# Patient Record
Sex: Male | Born: 1965 | Race: White | Hispanic: No | Marital: Married | Smoking: Former smoker
Health system: Southern US, Community
[De-identification: ages and names within clinical notes are randomized; demographics above are authoritative.]

## PROBLEM LIST (undated history)

## (undated) DIAGNOSIS — C329 Malignant neoplasm of larynx, unspecified: Secondary | ICD-10-CM

## (undated) DIAGNOSIS — M1990 Unspecified osteoarthritis, unspecified site: Secondary | ICD-10-CM

## (undated) HISTORY — DX: Unspecified osteoarthritis, unspecified site: M19.90

## (undated) HISTORY — PX: PLANTAR FASCIA RELEASE: SHX2239

## (undated) HISTORY — PX: FOOT SURGERY: SHX648

## (undated) HISTORY — PX: VASECTOMY: SHX75

## (undated) HISTORY — DX: Malignant neoplasm of larynx, unspecified: C32.9

## (undated) HISTORY — PX: BUNIONECTOMY: SHX129

---

## 2013-07-30 ENCOUNTER — Encounter (INDEPENDENT_AMBULATORY_CARE_PROVIDER_SITE_OTHER): Payer: Self-pay | Admitting: *Deleted

## 2013-08-26 ENCOUNTER — Ambulatory Visit (INDEPENDENT_AMBULATORY_CARE_PROVIDER_SITE_OTHER): Payer: TRICARE For Life (TFL) | Admitting: Internal Medicine

## 2013-08-26 ENCOUNTER — Encounter (INDEPENDENT_AMBULATORY_CARE_PROVIDER_SITE_OTHER): Payer: Self-pay | Admitting: Internal Medicine

## 2013-08-26 VITALS — BP 100/62 | HR 70 | Temp 98.3°F | Ht 69.0 in | Wt 179.1 lb

## 2013-08-26 DIAGNOSIS — R748 Abnormal levels of other serum enzymes: Secondary | ICD-10-CM

## 2013-08-26 LAB — COMPREHENSIVE METABOLIC PANEL
AST: 20 U/L (ref 0–37)
Alkaline Phosphatase: 90 U/L (ref 39–117)
BUN: 11 mg/dL (ref 6–23)
CO2: 27 mEq/L (ref 19–32)
Calcium: 9 mg/dL (ref 8.4–10.5)
Chloride: 105 mEq/L (ref 96–112)
Creat: 0.86 mg/dL (ref 0.50–1.35)
Glucose, Bld: 71 mg/dL (ref 70–99)

## 2013-08-26 NOTE — Progress Notes (Addendum)
Subjective:     Patient ID: Jeffery Ortiz, male   DOB: 1965/12/25, 47 y.o.   MRN: [id number]  HPI Referred to our office by Dr. Loney Hering for elevated liver enzymes. Apparently was seen in the ED at [facility] in September for urinary obstruction (could not void)and noted his LFTs were elevated. He says before going to the ED, he had diarrhea, felt drained, and also had a fever.  He received 1 liter of fluid in the ED. Felt he was dehydrated.  Bilirubin was also elevated. He was discharged and apparently was also seen at [facility] 2 days later. He was unable to void.  Foley cath was inserted and he was advised to follow up with a urologist. He kept the foley for a week. He followed up with urologist at [facility] and was told he had prostatitis. He was placed on Cipro and Flomax x 3 weeks. He tells me now he is fine. No problems with voiding.  His urine is clear. No blood.  Appetite is good. No weight loss. He is 100% better at this time.  CT abdomen/pelvis w/cm at [facility]: 07/12/2013 1. Trace free fluid in the pelvis, abnormal but nonspecific. 2. Trace fluid along the inferior pole of the right kidney, nonspecific. Recommend correlation with urinalysis. 3. Colonic diverticulosis without CT evidence of acute diverticulitis. 4. Prostatomegaly. The bladder is decompressed and a Foley catheter is present. Recommend correlation for signs/symptoms of chronic bladder outlet obstruction.  Findings discussed with Yehuda Budd, NP of the emergency department by Dr. Daralene Milch via telephone at approximately 1453 on July 12, 2013.    07/10/2013 ALP 120, AST 50, Total bil 2.8, ALT 72. H and H 14.5 and 42.1 Urinalysis: Moderate leukocytes (40-50). RBC 75-100.  Negative for bilirubin. Large amt of blood.  07/12/2013 Total bili 1.8, ALP 146, AST 36, ALT 77  Review of Systems see hpi Current Outpatient Prescriptions  Medication Sig Dispense Refill  . calcium carbonate 200 MG capsule Take 250 mg by mouth 2 (two) times  daily with a meal.      . Multiple Vitamin (MULTIVITAMIN) tablet Take 1 tablet by mouth daily.       No current facility-administered medications for this visit.   History reviewed. No pertinent past medical history. Past Surgical History  Procedure Laterality Date  . Vasectomy    . Foot surgery      x 4 (bone spurs)         Objective:   Physical Exam  Filed Vitals:   08/26/13 1600  BP: 100/62  Pulse: 70  Temp: 98.3 F (36.8 C)  Height: 5\' 9"  (1.753 m)  Weight: 179 lb 1.6 oz (81.239 kg)   Alert and oriented. Skin warm and dry. Oral mucosa is moist.   . Sclera anicteric, conjunctivae is pink. Thyroid not enlarged. No cervical lymphadenopathy. Lungs clear. Heart regular rate and rhythm.  Abdomen is soft. Bowel sounds are positive. No hepatomegaly. No abdominal masses felt. No tenderness.  No edema to lower extremities. No jaundice.     Assessment:    Elevated liver enzymes. ? Related to his dehydration.    Plan:    I am going to repeat his CMet. If normal, no further work-up. If elevated will do full work up.

## 2013-08-26 NOTE — Patient Instructions (Signed)
Cmet today. If normal, no further work up

## 2013-08-27 ENCOUNTER — Telehealth (INDEPENDENT_AMBULATORY_CARE_PROVIDER_SITE_OTHER): Payer: Self-pay | Admitting: *Deleted

## 2013-08-27 NOTE — Telephone Encounter (Signed)
.  Per Terri Setzer,NP labs in 3 months. 

## 2013-09-02 NOTE — Progress Notes (Signed)
Apt has been scheduled for 12/03/12 with Terri Setzer, NP.  

## 2013-09-02 NOTE — Progress Notes (Signed)
Apt has been scheduled for 12/03/12 with Dorene Ar, NP.

## 2013-09-10 ENCOUNTER — Encounter (HOSPITAL_COMMUNITY): Payer: Self-pay | Admitting: Emergency Medicine

## 2013-09-10 ENCOUNTER — Emergency Department (HOSPITAL_COMMUNITY)
Admission: EM | Admit: 2013-09-10 | Discharge: 2013-09-10 | Disposition: A | Discharge status: Home / Self Care | Payer: Worker's Compensation | Attending: Emergency Medicine | Admitting: Emergency Medicine

## 2013-09-10 DIAGNOSIS — Y9389 Activity, other specified: Secondary | ICD-10-CM | POA: Insufficient documentation

## 2013-09-10 DIAGNOSIS — T25211A Burn of second degree of right ankle, initial encounter: Secondary | ICD-10-CM

## 2013-09-10 DIAGNOSIS — Y990 Civilian activity done for income or pay: Secondary | ICD-10-CM | POA: Insufficient documentation

## 2013-09-10 DIAGNOSIS — IMO0002 Reserved for concepts with insufficient information to code with codable children: Secondary | ICD-10-CM | POA: Insufficient documentation

## 2013-09-10 DIAGNOSIS — Z87891 Personal history of nicotine dependence: Secondary | ICD-10-CM | POA: Insufficient documentation

## 2013-09-10 DIAGNOSIS — Y9289 Other specified places as the place of occurrence of the external cause: Secondary | ICD-10-CM | POA: Insufficient documentation

## 2013-09-10 DIAGNOSIS — T25219A Burn of second degree of unspecified ankle, initial encounter: Secondary | ICD-10-CM | POA: Insufficient documentation

## 2013-09-10 MED ORDER — SILVER SULFADIAZINE 1 % EX CREA
TOPICAL_CREAM | Freq: Once | CUTANEOUS | Status: AC
  Administered 2013-09-10: 1 via TOPICAL
  Filled 2013-09-10: qty 50

## 2013-09-10 MED ORDER — HYDROCODONE-ACETAMINOPHEN 5-325 MG PO TABS
1.0000 | ORAL_TABLET | Freq: Once | ORAL | Status: AC
  Administered 2013-09-10: 1 via ORAL
  Filled 2013-09-10: qty 1

## 2013-09-10 MED ORDER — HYDROCODONE-ACETAMINOPHEN 5-325 MG PO TABS: 1.0000 | ORAL_TABLET | ORAL | Status: DC | PRN

## 2013-09-10 NOTE — ED Notes (Signed)
Right foot cleaned with saf clens, soap, and cool water. Telfa, kerlix, and ace bandage applied.

## 2013-09-10 NOTE — ED Provider Notes (Signed)
CSN: [id number]     Arrival date & time 09/10/13  1239 History   First MD Initiated Contact with Patient 09/10/13 1258     No chief complaint on file.  (Consider location/radiation/quality/duration/timing/severity/associated sxs/prior Treatment) Patient is a 47 y.o. male presenting with burn.  Burn Burn location:  Leg Leg burn location:  R ankle Burn quality:  Painful and red Time since incident:  1 hour Progression:  Unchanged Pain details:    Severity:  Moderate   Timing:  Constant Mechanism of burn:  Chemical Incident location:  Work Relieved by:  None tried Worsened by:  Rubbing and movement Ineffective treatments:  None tried Tetanus status:  Up to date  ONEY FOLZ is a 47 y.o. male who presents to the ED with a burn to the inside of the right ankle. He was working on a tractor when a Arts administrator blew off and antifreeze burned his right ankle. Some also splashed on the left calf. He has a blister on the ankle and red spots on the calf. He denies any other injuries.   History reviewed. No pertinent past medical history. Past Surgical History  Procedure Laterality Date  . Vasectomy    . Foot surgery      x 4 (bone spurs)   No family history on file. History  Substance Use Topics  . Smoking status: Former Games developer  . Smokeless tobacco: Not on file     Comment: quit 1999  . Alcohol Use: Yes     Comment: occasionally    Review of Systems Negative except as stated in HPI  Allergies  Review of patient's allergies indicates no known allergies.  Home Medications   Current Outpatient Rx  Name  Route  Sig  Dispense  Refill  . calcium carbonate 200 MG capsule   Oral   Take 250 mg by mouth 2 (two) times daily with a meal.         . Multiple Vitamin (MULTIVITAMIN) tablet   Oral   Take 1 tablet by mouth daily.          BP 124/87  Pulse 84  Temp(Src) 98.2 F (36.8 C) (Oral)  Resp 18  Ht 5\' 9"  (1.753 m)  Wt 170 lb (77.111 kg)  BMI 25.09 kg/m2  SpO2  100% Physical Exam  Nursing note and vitals reviewed. Constitutional: He is oriented to person, place, and time. He appears well-developed and well-nourished. No distress.  HENT:  Head: Normocephalic and atraumatic.  Eyes: Conjunctivae and EOM are normal.  Neck: Normal range of motion. Neck supple.  Cardiovascular: Normal rate.   Pulmonary/Chest: Effort normal.  Musculoskeletal:       Right ankle: He exhibits swelling. He exhibits normal range of motion, no laceration and normal pulse. Tenderness.       Feet:  Approximately 2 cm blister area to the inner aspect of the right ankle with small area of erythema surrounding the blister.  Neurological: He is alert and oriented to person, place, and time. No cranial nerve deficit.  Skin: Skin is warm and dry.  Psychiatric: He has a normal mood and affect. His behavior is normal.    ED Course  Procedures  EKG Interpretation   None      MDM  47 y.o. male with second degree burn to the right ankle after injury at work. Area cleaned with sur cleanse and NSS. Silvadene burn dressing applied. Patient to return in the morning for recheck.  Discussed with the patient and  all questioned fully answered.    Medication List    TAKE these medications       HYDROcodone-acetaminophen 5-325 MG per tablet  Commonly known as:  NORCO/VICODIN  Take 1 tablet by mouth every 4 (four) hours as needed.      ASK your doctor about these medications       calcium carbonate 200 MG capsule  Take 250 mg by mouth 2 (two) times daily with a meal.     multivitamin tablet  Take 1 tablet by mouth daily.         Janne Napoleon, NP 09/10/13 1721

## 2013-09-10 NOTE — ED Notes (Signed)
Pt reports was at work and the radiator hose on a tractor blew off and antifreeze burned his r ankle and left calf.  Blister to r ankle.  Red splotches to left calf.

## 2013-09-11 ENCOUNTER — Emergency Department (HOSPITAL_COMMUNITY)
Admission: EM | Admit: 2013-09-11 | Discharge: 2013-09-11 | Disposition: A | Discharge status: Home / Self Care | Payer: Worker's Compensation | Attending: Emergency Medicine | Admitting: Emergency Medicine

## 2013-09-11 ENCOUNTER — Encounter (HOSPITAL_COMMUNITY): Payer: Self-pay | Admitting: Emergency Medicine

## 2013-09-11 DIAGNOSIS — T25219A Burn of second degree of unspecified ankle, initial encounter: Secondary | ICD-10-CM | POA: Insufficient documentation

## 2013-09-11 DIAGNOSIS — Z87891 Personal history of nicotine dependence: Secondary | ICD-10-CM | POA: Insufficient documentation

## 2013-09-11 DIAGNOSIS — IMO0002 Reserved for concepts with insufficient information to code with codable children: Secondary | ICD-10-CM | POA: Insufficient documentation

## 2013-09-11 DIAGNOSIS — Y929 Unspecified place or not applicable: Secondary | ICD-10-CM | POA: Insufficient documentation

## 2013-09-11 DIAGNOSIS — Y939 Activity, unspecified: Secondary | ICD-10-CM | POA: Insufficient documentation

## 2013-09-11 NOTE — ED Provider Notes (Signed)
  Medical screening examination/treatment/procedure(s) were performed by non-physician practitioner and as supervising physician I was immediately available for consultation/collaboration.     Rosalene Wardrop, MD 09/11/13 0755 

## 2013-09-11 NOTE — ED Notes (Signed)
Here for wound recheck right foot  Burned yesterday from antifreeze .

## 2013-09-12 NOTE — ED Provider Notes (Signed)
CSN: [id number]     Arrival date & time 09/11/13  1149 History   First MD Initiated Contact with Patient 09/11/13 1159     Chief Complaint  Patient presents with  . Wound Check   (Consider location/radiation/quality/duration/timing/severity/associated sxs/prior Treatment) Patient is a 47 y.o. male presenting with wound check. The history is provided by the patient.  Wound Check This is a new problem. Episode onset: 09/10/13. The problem occurs constantly. The problem has been gradually improving. Pertinent negatives include no chills, fever, joint swelling, myalgias, nausea, neck pain, numbness, rash, vomiting or weakness. The symptoms are aggravated by walking. Treatments tried: silvadene cream. The treatment provided moderate relief.   Patient returns here for recheck of a burn the right ankle that resulted from hot liquid.  Has blister to the ankle.  Reports redness and pain are improving.  He denies any worsening symptoms   History reviewed. No pertinent past medical history. Past Surgical History  Procedure Laterality Date  . Vasectomy    . Foot surgery      x 4 (bone spurs)   History reviewed. No pertinent family history. History  Substance Use Topics  . Smoking status: Former Games developer  . Smokeless tobacco: Not on file     Comment: quit 1999  . Alcohol Use: Yes     Comment: occasionally    Review of Systems  Constitutional: Negative for fever and chills.  Gastrointestinal: Negative for nausea and vomiting.  Genitourinary: Negative for dysuria and difficulty urinating.  Musculoskeletal: Negative for gait problem, joint swelling, myalgias and neck pain.  Skin: Negative for color change, rash and wound.       Burn to the right ankle  Neurological: Negative for weakness and numbness.  All other systems reviewed and are negative.    Allergies  Review of patient's allergies indicates no known allergies.  Home Medications   Current Outpatient Rx  Name  Route  Sig   Dispense  Refill  . calcium carbonate 200 MG capsule   Oral   Take 250 mg by mouth 2 (two) times daily with a meal.         . HYDROcodone-acetaminophen (NORCO/VICODIN) 5-325 MG per tablet   Oral   Take 1 tablet by mouth every 4 (four) hours as needed.   15 tablet   0   . Multiple Vitamin (MULTIVITAMIN) tablet   Oral   Take 1 tablet by mouth daily.          BP 118/77  Pulse 71  Temp(Src) 97.8 F (36.6 C) (Oral)  Resp 18  SpO2 100% Physical Exam  Nursing note and vitals reviewed. Constitutional: He is oriented to person, place, and time. He appears well-developed and well-nourished. No distress.  HENT:  Head: Normocephalic and atraumatic.  Cardiovascular: Normal rate, regular rhythm, normal heart sounds and intact distal pulses.   Pulmonary/Chest: Effort normal and breath sounds normal. No respiratory distress.  Musculoskeletal: He exhibits tenderness. He exhibits no edema.  4 cm burn to the anterior right ankle with a small intact blister present.  ROM is preserved.  DP pulse is brisk,distal sensation intact.  No erythema, abrasion, bruising or bony deformity.  No proximal tenderness.  Neurological: He is alert and oriented to person, place, and time. He exhibits normal muscle tone. Coordination normal.  Skin: Skin is warm and dry. No rash noted.    ED Course  Procedures (including critical care time) Labs Review Labs Reviewed - No data to display Imaging Review No results found.  EKG Interpretation   None       MDM   1. Burn, second degree    Burn appears to be healing.  Blister remains intact.  Patient reports redness and pain are improved.  Wound re-dressed with silvadene.  Appears stable for d/c.  Agrees to return if needed    Karmina Zufall L. Trisha Mangle, PA-C 09/12/13 2134

## 2013-09-13 NOTE — ED Provider Notes (Signed)
Medical screening examination/treatment/procedure(s) were performed by non-physician practitioner and as supervising physician I was immediately available for consultation/collaboration.  EKG Interpretation   None         Amesha Bailey L Burdette Forehand, MD 09/13/13 0716 

## 2013-10-01 ENCOUNTER — Encounter (INDEPENDENT_AMBULATORY_CARE_PROVIDER_SITE_OTHER): Payer: Self-pay

## 2013-11-12 ENCOUNTER — Other Ambulatory Visit (INDEPENDENT_AMBULATORY_CARE_PROVIDER_SITE_OTHER): Payer: Self-pay | Admitting: *Deleted

## 2013-11-12 ENCOUNTER — Encounter (INDEPENDENT_AMBULATORY_CARE_PROVIDER_SITE_OTHER): Payer: Self-pay | Admitting: *Deleted

## 2013-11-12 DIAGNOSIS — R740 Nonspecific elevation of levels of transaminase and lactic acid dehydrogenase [LDH]: Principal | ICD-10-CM

## 2013-11-12 DIAGNOSIS — R7401 Elevation of levels of liver transaminase levels: Secondary | ICD-10-CM

## 2013-11-27 LAB — HEPATIC FUNCTION PANEL
ALBUMIN: 4.6 g/dL (ref 3.5–5.2)
ALT: 28 U/L (ref 0–53)
AST: 20 U/L (ref 0–37)
Alkaline Phosphatase: 81 U/L (ref 39–117)
Bilirubin, Direct: 0.3 mg/dL (ref 0.0–0.3)
Indirect Bilirubin: 1.4 mg/dL — ABNORMAL HIGH (ref 0.2–1.2)
Total Bilirubin: 1.7 mg/dL — ABNORMAL HIGH (ref 0.2–1.2)
Total Protein: 6.8 g/dL (ref 6.0–8.3)

## 2013-12-03 ENCOUNTER — Ambulatory Visit (INDEPENDENT_AMBULATORY_CARE_PROVIDER_SITE_OTHER): Payer: BC Managed Care – PPO | Admitting: Internal Medicine

## 2013-12-03 ENCOUNTER — Encounter (INDEPENDENT_AMBULATORY_CARE_PROVIDER_SITE_OTHER): Payer: Self-pay | Admitting: Internal Medicine

## 2013-12-03 VITALS — BP 126/60 | HR 72 | Temp 97.3°F | Ht 69.0 in | Wt 189.0 lb

## 2013-12-03 DIAGNOSIS — R17 Unspecified jaundice: Secondary | ICD-10-CM | POA: Insufficient documentation

## 2013-12-03 NOTE — Progress Notes (Addendum)
Subjective:     Patient ID: Jeffery Ortiz, male   DOB: 1965-11-26, 48 y.o.   MRN: [id number]  HPI Here today for f/u of elevated LFTs. He was last seen in November of 2014. He was referred here by Dr. Wenda Overland. States he is doing okay.  Appetite is good. No weight loss. Has regular BMs.  No abdominal pain. He is voiding without difficulty.  Continues to work.  Seen at [facility] in September for urinary obstruction. Foley inserted. He had prostatitis.  Also seen at [facility] for same. Noted his liver enzymes were elevated at that time with an elevated bilirubin. Repeat cmet was normal.     He is 100% better. He has 4 tattoos. No IV drug use. He has given blood.        07/10/2013 ALP 120, AST 50, Total bil 2.0, ALT 72.  Hepatic Function Panel     Component Value Date/Time   PROT 6.8 11/27/2013 1020   ALBUMIN 4.6 11/27/2013 1020   AST 20 11/27/2013 1020   ALT 28 11/27/2013 1020   ALKPHOS 81 11/27/2013 1020   BILITOT 1.7* 11/27/2013 1020   BILIDIR 0.3 11/27/2013 1020   IBILI 1.4* 11/27/2013 1020   CMP     Component Value Date/Time   NA 139 08/26/2013 1625   K 3.8 08/26/2013 1625   CL 105 08/26/2013 1625   CO2 27 08/26/2013 1625   GLUCOSE 71 08/26/2013 1625   BUN 11 08/26/2013 1625   CREATININE 0.86 08/26/2013 1625   CALCIUM 9.0 08/26/2013 1625   PROT 6.8 11/27/2013 1020   ALBUMIN 4.6 11/27/2013 1020   AST 20 11/27/2013 1020   ALT 28 11/27/2013 1020   ALKPHOS 81 11/27/2013 1020   BILITOT 1.7* 11/27/2013 1020          CT abdomen/pelvis w/cm at [facility]: 07/12/2013 1. Trace free fluid in the pelvis, abnormal but nonspecific. 2. Trace fluid along the inferior pole of the right kidney, nonspecific. Recommend correlation with urinalysis. 3. Colonic diverticulosis without CT evidence of acute diverticulitis. 4. Prostatomegaly. The bladder is decompressed and a Foley catheter is present. Recommend correlation for signs/symptoms of chronic bladder outlet obstruction.  Findings discussed with Vaughan Browner, NP of the  emergency department by Dr. Wallis Bamberg via telephone at approximately 1453 on July 12, 2013.      Review of Systems  History reviewed. No pertinent past medical history. Past Surgical History  Procedure Laterality Date  . Vasectomy    . Foot surgery      x 4 (bone spurs)   No Known Allergies   No Known Allergies  History reviewed. No pertinent past medical history.  Past Surgical History  Procedure Laterality Date  . Vasectomy    . Foot surgery      x 4 (bone spurs)    No Known Allergies  Current Outpatient Prescriptions on File Prior to Visit  Medication Sig Dispense Refill  . calcium carbonate 200 MG capsule Take 250 mg by mouth 2 (two) times daily with a meal.      . Multiple Vitamin (MULTIVITAMIN) tablet Take 1 tablet by mouth daily.       No current facility-administered medications on file prior to visit.        Objective:   Physical Exam  Filed Vitals:   12/03/13 1555  BP: 126/60  Pulse: 72  Temp: 97.3 F (36.3 C)  Height: 5\' 9"  (1.753 m)  Weight: 189 lb (85.73 kg)    Alert and  oriented. Skin warm and dry. Oral mucosa is moist.   . Sclera anicteric, conjunctivae is pink. Thyroid not enlarged. No cervical lymphadenopathy. Lungs clear. Heart regular rate and rhythm.  Abdomen is soft. Bowel sounds are positive. No hepatomegaly. No abdominal masses felt. No tenderness.  No edema to lower extremities.       Assessment:    Elevated bilirubin slightly. Patient has no complaints. Indirect bilirubin was slightly elevated.     Plan:    Hepatic function in 3 months. OV in 6 months.

## 2013-12-03 NOTE — Patient Instructions (Signed)
OV in 6 months. Hepatic function in 3 months

## 2013-12-23 ENCOUNTER — Telehealth (INDEPENDENT_AMBULATORY_CARE_PROVIDER_SITE_OTHER): Payer: Self-pay | Admitting: *Deleted

## 2013-12-23 DIAGNOSIS — R17 Unspecified jaundice: Secondary | ICD-10-CM

## 2013-12-23 NOTE — Telephone Encounter (Signed)
.  Per Terri Setzer,NP labs in 3 months. 

## 2014-02-18 ENCOUNTER — Ambulatory Visit (INDEPENDENT_AMBULATORY_CARE_PROVIDER_SITE_OTHER): Payer: BC Managed Care – PPO | Admitting: Surgery

## 2014-02-18 ENCOUNTER — Encounter (INDEPENDENT_AMBULATORY_CARE_PROVIDER_SITE_OTHER): Payer: Self-pay | Admitting: Surgery

## 2014-02-18 VITALS — BP 126/84 | HR 64 | Resp 12 | Ht 69.0 in | Wt 181.0 lb

## 2014-02-18 DIAGNOSIS — K648 Other hemorrhoids: Secondary | ICD-10-CM

## 2014-02-18 DIAGNOSIS — K625 Hemorrhage of anus and rectum: Secondary | ICD-10-CM

## 2014-02-18 NOTE — Patient Instructions (Addendum)
Hemorrhoids Hemorrhoids are swollen veins around the rectum or anus. There are two types of hemorrhoids:   Internal hemorrhoids. These occur in the veins just inside the rectum. They may poke through to the outside and become irritated and painful.  External hemorrhoids. These occur in the veins outside the anus and can be felt as a painful swelling or hard lump near the anus. CAUSES  Pregnancy.   Obesity.   Constipation or diarrhea.   Straining to have a bowel movement.   Sitting for long periods on the toilet.  Heavy lifting or other activity that caused you to strain.  Anal intercourse. SYMPTOMS   Pain.   Anal itching or irritation.   Rectal bleeding.   Fecal leakage.   Anal swelling.   One or more lumps around the anus.  DIAGNOSIS  Your caregiver may be able to diagnose hemorrhoids by visual examination. Other examinations or tests that may be performed include:   Examination of the rectal area with a gloved hand (digital rectal exam).   Examination of anal canal using a small tube (scope).   A blood test if you have lost a significant amount of blood.  A test to look inside the colon (sigmoidoscopy or colonoscopy). TREATMENT Most hemorrhoids can be treated at home. However, if symptoms do not seem to be getting better or if you have a lot of rectal bleeding, your caregiver may perform a procedure to help make the hemorrhoids get smaller or remove them completely. Possible treatments include:   Placing a rubber band at the base of the hemorrhoid to cut off the circulation (rubber band ligation).   Injecting a chemical to shrink the hemorrhoid (sclerotherapy).   Using a tool to burn the hemorrhoid (infrared light therapy).   Surgically removing the hemorrhoid (hemorrhoidectomy).   Stapling the hemorrhoid to block blood flow to the tissue (hemorrhoid stapling).  HOME CARE INSTRUCTIONS   Eat foods with fiber, such as whole grains, beans,  nuts, fruits, and vegetables. Ask your doctor about taking products with added fiber in them (fibersupplements).  Increase fluid intake. Drink enough water and fluids to keep your urine clear or pale yellow.   Exercise regularly.   Go to the bathroom when you have the urge to have a bowel movement. Do not wait.   Avoid straining to have bowel movements.   Keep the anal area dry and clean. Use wet toilet paper or moist towelettes after a bowel movement.   Medicated creams and suppositories may be used or applied as directed.   Only take over-the-counter or prescription medicines as directed by your caregiver.   Take warm sitz baths for 15 20 minutes, 3 4 times a day to ease pain and discomfort.   Place ice packs on the hemorrhoids if they are tender and swollen. Using ice packs between sitz baths may be helpful.   Put ice in a plastic bag.   Place a towel between your skin and the bag.   Leave the ice on for 15 20 minutes, 3 4 times a day.   Do not use a donut-shaped pillow or sit on the toilet for long periods. This increases blood pooling and pain.  SEEK MEDICAL CARE IF:  You have increasing pain and swelling that is not controlled by treatment or medicine.  You have uncontrolled bleeding.  You have difficulty or you are unable to have a bowel movement.  You have pain or inflammation outside the area of the hemorrhoids. MAKE SURE YOU:    Understand these instructions.  Will watch your condition.  Will get help right away if you are not doing well or get worse. Document Released: 10/05/2000 Document Revised: 09/24/2012 Document Reviewed: 08/12/2012 [facility] Patient Information 2014 [place].   Use TUCKS or baby wipes to have better hygiene.  High-Fiber Diet Fiber is found in fruits, vegetables, and grains. A high-fiber diet encourages the addition of more whole grains, legumes, fruits, and vegetables in your diet. The recommended amount of fiber  for adult males is 38 g per day. For adult females, it is 25 g per day. Pregnant and lactating women should get 28 g of fiber per day. If you have a digestive or bowel problem, ask your caregiver for advice before adding high-fiber foods to your diet. Eat a variety of high-fiber foods instead of only a select few type of foods.  PURPOSE  To increase stool bulk.  To make bowel movements more regular to prevent constipation.  To lower cholesterol.  To prevent overeating. WHEN IS THIS DIET USED?  It may be used if you have constipation and hemorrhoids.  It may be used if you have uncomplicated diverticulosis (intestine condition) and irritable bowel syndrome.  It may be used if you need help with weight management.  It may be used if you want to add it to your diet as a protective measure against atherosclerosis, diabetes, and cancer. SOURCES OF FIBER  Whole-grain breads and cereals.  Fruits, such as apples, oranges, bananas, berries, prunes, and pears.  Vegetables, such as green peas, carrots, sweet potatoes, beets, broccoli, cabbage, spinach, and artichokes.  Legumes, such split peas, soy, lentils.  Almonds. FIBER CONTENT IN FOODS Starches and Grains / Dietary Fiber (g)  Cheerios, 1 cup / 3 g  Corn Flakes cereal, 1 cup / 0.7 g  Rice crispy treat cereal, 1 cup / 0.3 g  Instant oatmeal (cooked),  cup / 2 g  Frosted wheat cereal, 1 cup / 5.1 g  Brown, long-grain rice (cooked), 1 cup / 3.5 g  White, long-grain rice (cooked), 1 cup / 0.6 g  Enriched macaroni (cooked), 1 cup / 2.5 g Legumes / Dietary Fiber (g)  Baked beans (canned, plain, or vegetarian),  cup / 5.2 g  Kidney beans (canned),  cup / 6.8 g  Pinto beans (cooked),  cup / 5.5 g Breads and Crackers / Dietary Fiber (g)  Plain or honey graham crackers, 2 squares / 0.7 g  Saltine crackers, 3 squares / 0.3 g  Plain, salted pretzels, 10 pieces / 1.8 g  Whole-wheat bread, 1 slice / 1.9 g  White  bread, 1 slice / 0.7 g  Raisin bread, 1 slice / 1.2 g  Plain bagel, 3 oz / 2 g  Flour tortilla, 1 oz / 0.9 g  Corn tortilla, 1 small / 1.5 g  Hamburger or hotdog bun, 1 small / 0.9 g Fruits / Dietary Fiber (g)  Apple with skin, 1 medium / 4.4 g  Sweetened applesauce,  cup / 1.5 g  Banana,  medium / 1.5 g  Grapes, 10 grapes / 0.4 g  Orange, 1 small / 2.3 g  Raisin, 1.5 oz / 1.6 g  Melon, 1 cup / 1.4 g Vegetables / Dietary Fiber (g)  Green beans (canned),  cup / 1.3 g  Carrots (cooked),  cup / 2.3 g  Broccoli (cooked),  cup / 2.8 g  Peas (cooked),  cup / 4.4 g  Mashed potatoes,  cup / 1.6 g  Lettuce,  1 cup / 0.5 g  Corn (canned),  cup / 1.6 g  Tomato,  cup / 1.1 g Document Released: 10/08/2005 Document Revised: 04/08/2012 Document Reviewed: 01/10/2012 [facility] Patient Information 2014 [place].

## 2014-02-18 NOTE — Progress Notes (Signed)
Chief Complaint:  Bleeding per rectum  History of Present Illness:  Jeffery Ortiz is an 48 y.o. male referred by Dr. Tresa Endo with intermittent rectal bleeding.  He had prostatitis and this began with a very vigorous rectal exam in the emergency room. Since then he will have intermittent burning and will notice sometimes bloody and stool discharge. He denies sitting and reading on the toilet and says he usually is fairly regular.  Past Medical History  Diagnosis Date  . Arthritis     Past Surgical History  Procedure Laterality Date  . Vasectomy    . Foot surgery      x 4 (bone spurs)  . Plantar fascia release    . Bunionectomy      Current Outpatient Prescriptions  Medication Sig Dispense Refill  . calcium carbonate 200 MG capsule Take 250 mg by mouth 2 (two) times daily with a meal.      . Multiple Vitamin (MULTIVITAMIN) tablet Take 1 tablet by mouth daily.       No current facility-administered medications for this visit.   Review of patient's allergies indicates no known allergies. Family History  Problem Relation Age of Onset  . Esophageal cancer Mother   . Throat cancer Father    Social History:   reports that he has quit smoking. He does not have any smokeless tobacco history on file. He reports that he drinks alcohol. He reports that he does not use illicit drugs.   REVIEW OF SYSTEMS - PERTINENT POSITIVES ONLY: No DVT.  Physical Exam:   Blood pressure 126/84, pulse 64, resp. rate 12, height 5\' 9"  (1.753 m), weight 181 lb (82.101 kg). Body mass index is 26.72 kg/(m^2).  Gen:  WDWN white male NAD  Neurological: Alert and oriented to person, place, and time. Motor and sensory function is grossly intact  Head: Normocephalic and atraumatic.  Eyes: Conjunctivae are normal. Pupils are equal, round, and reactive to light. No scleral icterus.  Neck: Normal range of motion. Neck supple. No tracheal deviation or thyromegaly present.  Cardiovascular:  SR without murmurs  or gallops.  No carotid bruits Respiratory: Effort normal.  No respiratory distress. No chest wall tenderness. Breath sounds normal.  No wheezes, rales or rhonchi.  Abdomen:  nontender GU:  External appearance of the anus shows no evidence of any fistula openings. There was some soilage in this area from not complete wiping. Rectal exam reveals a very tight sphincter but I could palpate what appeared to be internal hemorrhoids at the 7:00 and 10:00 positions. Endoscopy was performed which confirmed that there were prominent internal hemorrhoid columns but not large enough to prolapse and certainly not anything that I felt needed surgical intervention. There is no evidence of any tumors or masses either on endoscopy into digital exam. Musculoskeletal: Normal range of motion. Extremities are nontender. No cyanosis, edema or clubbing noted Lymphadenopathy: No cervical, preauricular, postauricular or axillary adenopathy is present Skin: Skin is warm and dry. No rash noted. No diaphoresis. No erythema. No pallor. Pscyh: Normal mood and affect. Behavior is normal. Judgment and thought content normal.   LABORATORY RESULTS: No results found for this or any previous visit (from the past 48 hour(s)).  RADIOLOGY RESULTS: No results found.  Problem List: Patient Active Problem List   Diagnosis Date Noted  . Elevated bilirubin 12/03/2013  . Elevated liver enzymes 08/26/2013    Assessment & Plan: Intermittent for rectal bleeding likely from hemorrhoids. Will get him to use some talks or  baby wipes to help with the hygiene and I high-fiber diet to maintain regularity and avoid straining. This should heal. I would like to recheck him in 2 months to make certain that it is significantly better or has resolved completely.      Matt B. Hassell Done, MD, [facility] [phone] beeper [phone]  02/18/2014 1:15 PM

## 2014-03-17 ENCOUNTER — Encounter (INDEPENDENT_AMBULATORY_CARE_PROVIDER_SITE_OTHER): Payer: Self-pay | Admitting: *Deleted

## 2014-03-17 ENCOUNTER — Other Ambulatory Visit (INDEPENDENT_AMBULATORY_CARE_PROVIDER_SITE_OTHER): Payer: Self-pay | Admitting: *Deleted

## 2014-03-17 DIAGNOSIS — R17 Unspecified jaundice: Secondary | ICD-10-CM

## 2014-03-24 ENCOUNTER — Telehealth (INDEPENDENT_AMBULATORY_CARE_PROVIDER_SITE_OTHER): Payer: Self-pay

## 2014-03-24 NOTE — Telephone Encounter (Signed)
Office notes faxed to Dr. Tresa Endo @ [facility] [phone].  Fax confirmation rec'd

## 2014-03-25 LAB — HEPATIC FUNCTION PANEL
ALBUMIN: 4.2 g/dL (ref 3.5–5.2)
ALT: 20 U/L (ref 0–53)
AST: 19 U/L (ref 0–37)
Alkaline Phosphatase: 64 U/L (ref 39–117)
Bilirubin, Direct: 0.3 mg/dL (ref 0.0–0.3)
Indirect Bilirubin: 0.9 mg/dL (ref 0.2–1.2)
Total Bilirubin: 1.2 mg/dL (ref 0.2–1.2)
Total Protein: 6.3 g/dL (ref 6.0–8.3)

## 2014-04-07 ENCOUNTER — Encounter (INDEPENDENT_AMBULATORY_CARE_PROVIDER_SITE_OTHER): Payer: Self-pay

## 2014-04-20 ENCOUNTER — Encounter (INDEPENDENT_AMBULATORY_CARE_PROVIDER_SITE_OTHER): Payer: Self-pay | Admitting: Surgery

## 2014-04-20 ENCOUNTER — Ambulatory Visit (INDEPENDENT_AMBULATORY_CARE_PROVIDER_SITE_OTHER): Payer: BC Managed Care – PPO | Admitting: Surgery

## 2014-04-20 VITALS — BP 120/75 | HR 63 | Temp 97.9°F | Resp 14 | Ht 69.0 in | Wt 178.4 lb

## 2014-04-20 DIAGNOSIS — K625 Hemorrhage of anus and rectum: Secondary | ICD-10-CM

## 2014-04-20 NOTE — Patient Instructions (Signed)
TUCKS at local pharmacy-over the counter-use daily after bowel movements and for irritation.

## 2014-04-20 NOTE — Progress Notes (Signed)
Jeffery Ortiz 48 y.o.  Body mass index is 26.33 kg/(m^2).  Patient Active Problem List   Diagnosis Date Noted  . Internal hemorrhoids 02/18/2014  . Elevated bilirubin 12/03/2013  . Elevated liver enzymes 08/26/2013    No Known Allergies    Past Surgical History  Procedure Laterality Date  . Vasectomy    . Foot surgery      x 4 (bone spurs)  . Plantar fascia release    . Bunionectomy     Jeffery Savage, MD No diagnosis found.  Still having some irritation. Anal exam performed.  Liquid stool in the perianal hair.   No fissure or fistula.  He notes some soilage and it may be poor hygiene.  Will recommend Tucks and will get an air contrast BE.   Jeffery B. Hassell Done, MD, [facility] [phone] beeper [phone]  04/20/2014 5:30 PM

## 2014-04-26 ENCOUNTER — Other Ambulatory Visit: Payer: Self-pay

## 2014-05-03 ENCOUNTER — Ambulatory Visit
Admission: RE | Admit: 2014-05-03 | Discharge: 2014-05-03 | Disposition: A | Discharge status: Home / Self Care | Payer: BC Managed Care – PPO | Source: Ambulatory Visit | Attending: Surgery | Admitting: Surgery

## 2014-05-03 DIAGNOSIS — K625 Hemorrhage of anus and rectum: Secondary | ICD-10-CM

## 2014-05-11 ENCOUNTER — Telehealth (INDEPENDENT_AMBULATORY_CARE_PROVIDER_SITE_OTHER): Payer: Self-pay

## 2014-05-11 NOTE — Telephone Encounter (Signed)
Pt was calling today to see if his results were in from his barium enema. Pt advised that impression was no polyp, mass or stricture. Pt would like to make sure that Dr Hassell Done has seen these results and that he does not need to inform him of anything else. Informed pt that I would send a message to Dr Hassell Done and his nurse. Pt verbalized understanding.

## 2014-05-19 ENCOUNTER — Encounter (INDEPENDENT_AMBULATORY_CARE_PROVIDER_SITE_OTHER): Payer: BC Managed Care – PPO | Admitting: Surgery

## 2014-05-19 ENCOUNTER — Telehealth (INDEPENDENT_AMBULATORY_CARE_PROVIDER_SITE_OTHER): Payer: Self-pay

## 2014-05-19 ENCOUNTER — Ambulatory Visit (INDEPENDENT_AMBULATORY_CARE_PROVIDER_SITE_OTHER): Payer: BC Managed Care – PPO | Admitting: Surgery

## 2014-05-19 NOTE — Telephone Encounter (Signed)
Spoke to patient to make aware of Barium Enema results.   Per Dr. Hassell Done patient imaging results Negative, no abnormalities.  Patient to follow up as needed.  Patient verbalized understanding

## 2014-05-19 NOTE — Telephone Encounter (Signed)
Called patient and left message to call our office.  Need to speak with patient regarding his imaging and appointment today

## 2014-05-27 ENCOUNTER — Encounter (INDEPENDENT_AMBULATORY_CARE_PROVIDER_SITE_OTHER): Payer: Self-pay | Admitting: Surgery

## 2014-06-02 ENCOUNTER — Ambulatory Visit (INDEPENDENT_AMBULATORY_CARE_PROVIDER_SITE_OTHER): Payer: Self-pay | Admitting: Internal Medicine

## 2018-02-19 ENCOUNTER — Other Ambulatory Visit: Payer: Self-pay | Admitting: Otolaryngology

## 2018-02-19 DIAGNOSIS — R591 Generalized enlarged lymph nodes: Secondary | ICD-10-CM

## 2018-02-21 ENCOUNTER — Ambulatory Visit
Admission: RE | Admit: 2018-02-21 | Discharge: 2018-02-21 | Disposition: A | Discharge status: Home / Self Care | Source: Ambulatory Visit | Attending: Otolaryngology | Admitting: Otolaryngology

## 2018-02-21 DIAGNOSIS — R591 Generalized enlarged lymph nodes: Secondary | ICD-10-CM

## 2018-02-21 MED ORDER — IOPAMIDOL (ISOVUE-300) INJECTION 61%
75.0000 mL | Freq: Once | INTRAVENOUS | Status: AC | PRN
  Administered 2018-02-21: 75 mL via INTRAVENOUS

## 2018-09-09 IMAGING — CT CT NECK W/ CM
4 of 7 series · 12 of 33 positions shown, 14 images · IV contrast (iopamidol)
Comparison: None available.

CLINICAL DATA: Initial evaluation for right laryngeal mass. History
of recent flu.

EXAM:
CT NECK WITH CONTRAST
TECHNIQUE: Multidetector CT imaging of the neck was performed using the
standard protocol following the bolus administration of intravenous
contrast.
CONTRAST:  75mL CD2SMV-HBB IOPAMIDOL (CD2SMV-HBB) INJECTION 61%

[Series 2: neck 2.00 br36 s3 (person_name) · axial · 0.52mm/px · z∈[-806,-712]mm · 2 of 142 slices shown]
[im 48/142  bone]
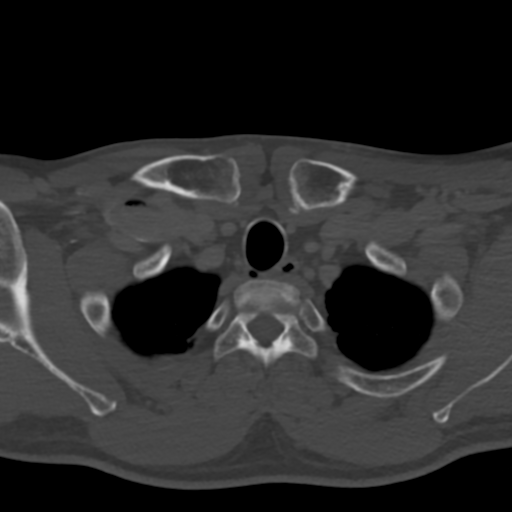
[im 95/142  bone]
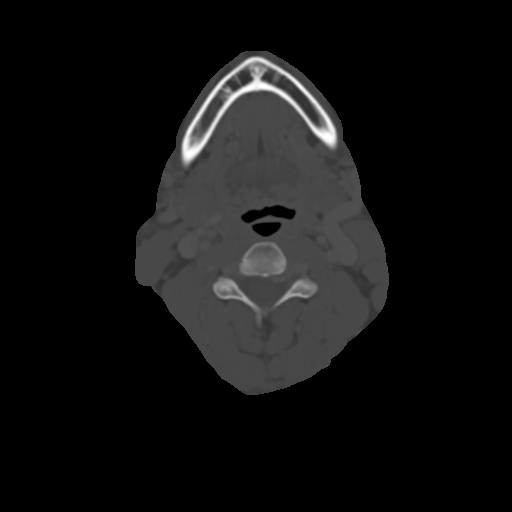

[Series 7: neck 2.00 br36 s3 cor coronal (person_name) · coronal · 0.51mm/px · 3 of 128 slices shown]
[im 28/128  bone]
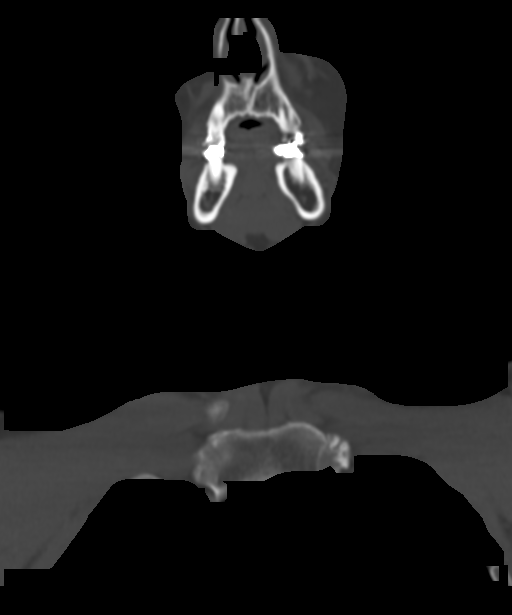
[im 52/128  bone]
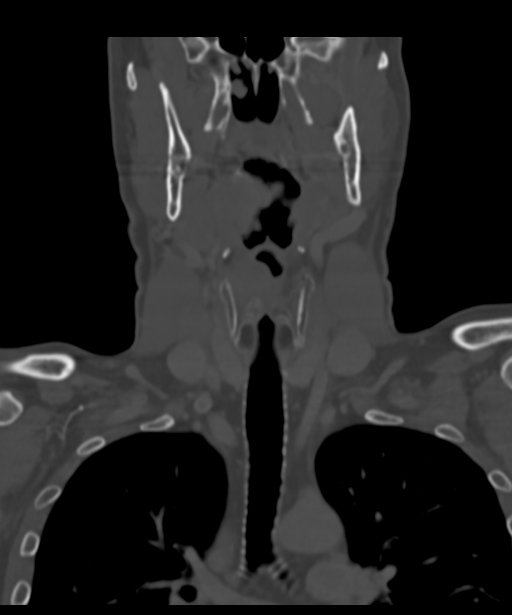
[im 76/128  bone]
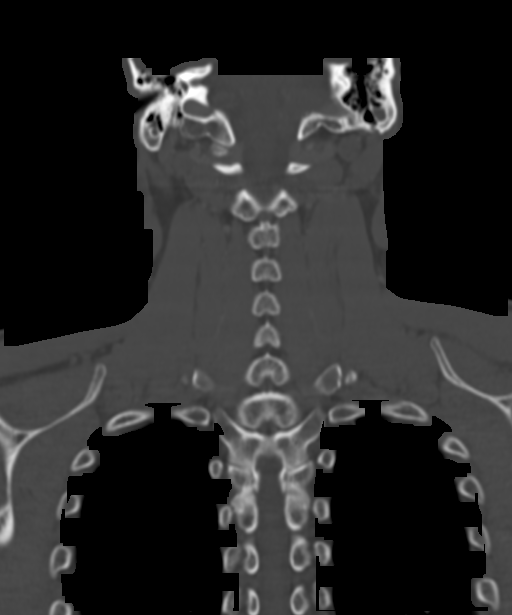

[Series 9: neck 2.00 br36 s3 sag sagittal (person_name) · sagittal · 0.50mm/px · 5 of 131 slices shown, 6 images]
[im 44/131  bone]
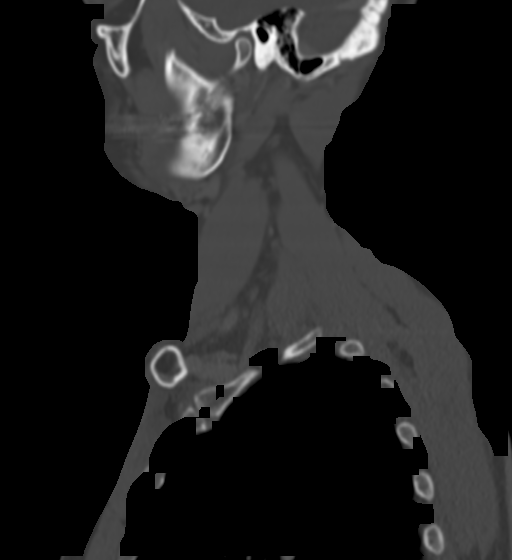
[im 55/131  bone]
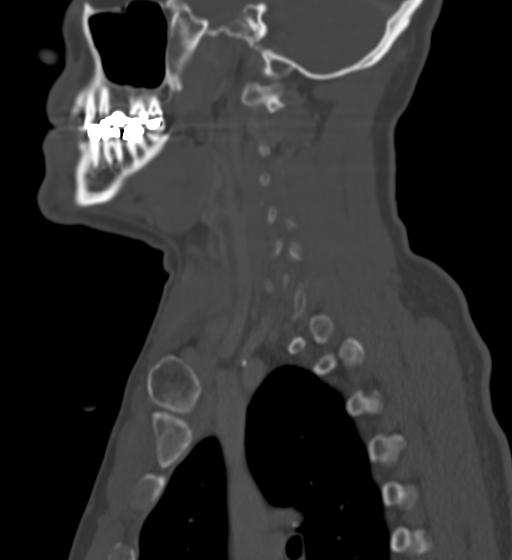
[im 66/131  soft-tissue]
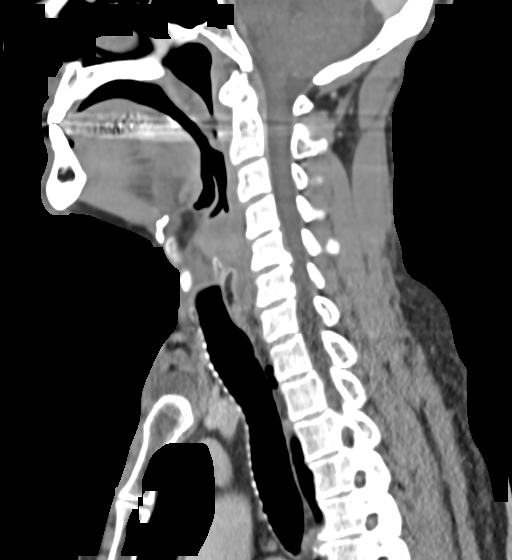
[im 66/131  bone]
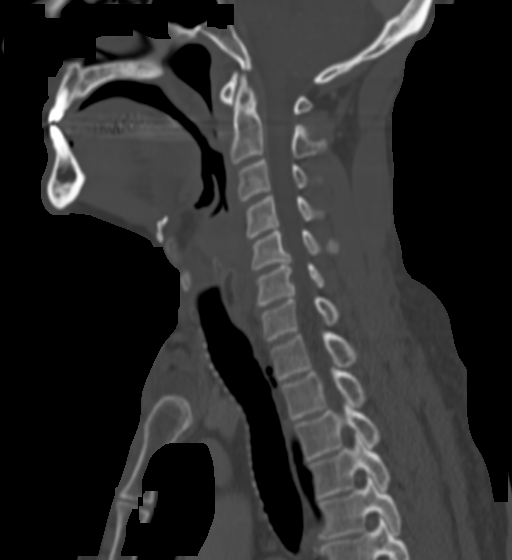
[im 76/131  bone]
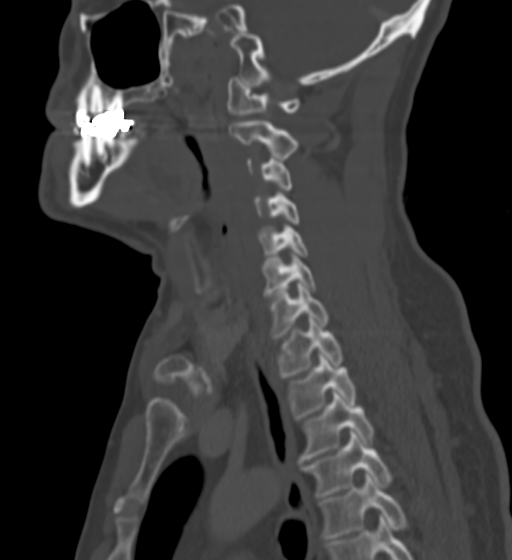
[im 87/131  bone]
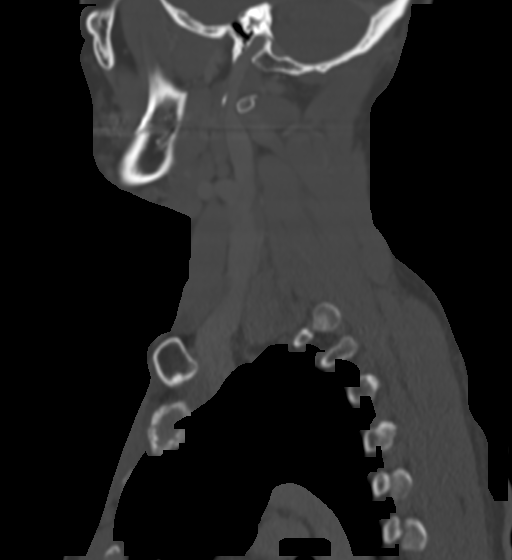

[Series 11: neck 2.00 br36 s3 ax hyoid hd fov · axial · 0.50mm/px · z∈[-837,-730]mm · 2 of 165 slices shown, 3 images]
[im 55/165  soft-tissue]
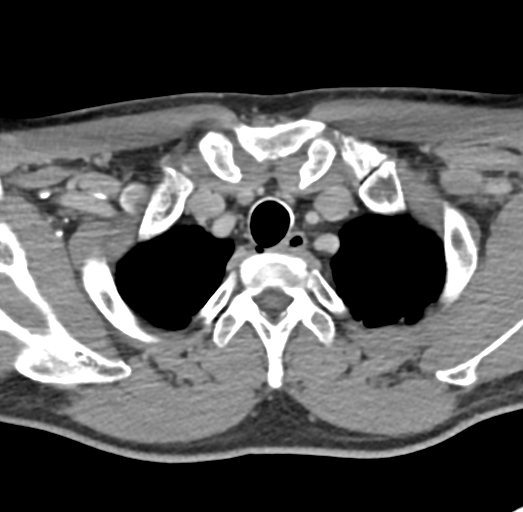
[im 55/165  bone]
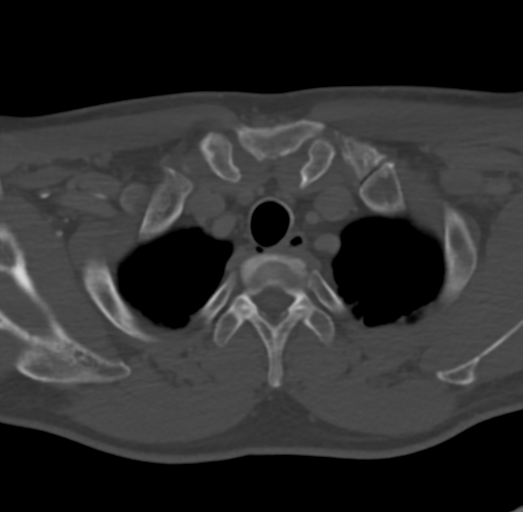
[im 110/165  bone]
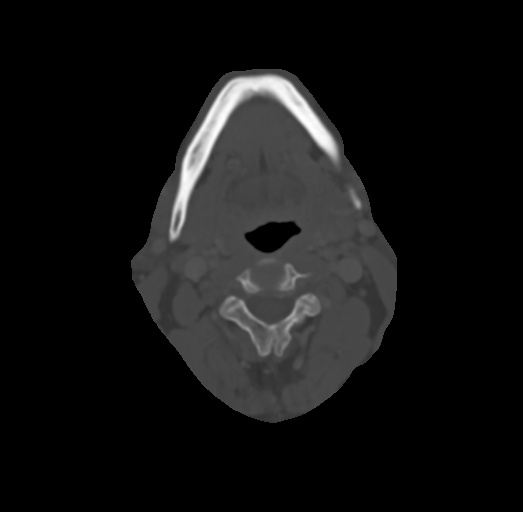

[12 of 33 positions shown; findings below may reference images not displayed]

FINDINGS: Pharynx and larynx: Oral cavity is normal. No acute inflammatory
changes about the dentition. Palatine tonsils symmetric and within
normal limits. Remainder of the oropharynx and nasopharynx are
normal. Parapharyngeal fat maintained. No retropharyngeal swelling
or collection. The epiglottis itself is normal. Vallecula clear. At
the base of the right aryepiglottic fold, near the level of the
right false cord there is a round fairly homogeneous, somewhat
hyperdense mass measuring 2.3 x 2.6 x 2.6 cm (AP by transverse by
craniocaudad). Adjacent right piriform sinus is effaced. Margin of
the mass is somewhat indistinct laterally and posteriorly, but
otherwise well-circumscribed. Adjacent pre epiglottic fat fairly
well maintained. Lesion reaches the midline. Adjacent left piriform
sinuses clear. Lesion favored to be separate from the true cords
inferiorly. Cricoid and arytenoid cartilages are maintained. No
erosive changes within the adjacent thyroid cartilage. The
subglottic airway is clear inferiorly.

Salivary glands: Salivary glands including the parotid and
submandibular glands are normal.

Thyroid: Thyroid normal.

Lymph nodes: 2 adjacent enlarged left level III lymph nodes measure
19 mm (series 2, image 72) in 11 mm (series 2, image 60). Nodes are
of similar attenuation as compared to the adjacent right laryngeal
mass. No other pathologically enlarged lymph nodes identified within
the neck. No left-sided adenopathy.

Vascular: Normal intravascular enhancement seen throughout the neck.

Limited intracranial: Unremarkable.

Visualized orbits: Partially visualized globes and orbital soft
tissues within normal limits.

Mastoids and visualized paranasal sinuses: Minimal mucosal
thickening within the right maxillary sinus. Visualized paranasal
sinuses are otherwise clear. Small left mastoid effusion noted, of
doubtful significance. Right mastoids are clear. Middle ear cavities
are clear and well pneumatized. No abnormality at the nasopharynx.

Skeleton: No acute osseus abnormality. No worrisome lytic or blastic
osseous lesions. Mild degenerate spondylolysis noted at C5-6. Mild
OPLL noted at C4-5.

Upper chest: Visualized upper chest and mediastinum within normal
limits. Visualized lungs are clear.

Other: None.
IMPRESSION: 1. 2.3 x 2.6 x 2.6 cm right laryngeal mass positioned at the base of
the right aryepiglottic fold/false cord, indeterminate, but
worrisome for primary head neck malignancy.
2. Adjacent right level III adenopathy as above, concerning for
nodal metastases.

## 2020-05-05 ENCOUNTER — Other Ambulatory Visit: Payer: Self-pay | Admitting: Neurosurgery

## 2020-05-11 ENCOUNTER — Encounter: Payer: Self-pay | Admitting: Neurology

## 2020-05-24 ENCOUNTER — Encounter: Payer: Self-pay | Admitting: Neurology

## 2020-05-24 ENCOUNTER — Ambulatory Visit (INDEPENDENT_AMBULATORY_CARE_PROVIDER_SITE_OTHER): Payer: No Typology Code available for payment source | Admitting: Neurology

## 2020-05-24 DIAGNOSIS — M5412 Radiculopathy, cervical region: Secondary | ICD-10-CM | POA: Diagnosis not present

## 2020-05-24 DIAGNOSIS — R202 Paresthesia of skin: Secondary | ICD-10-CM

## 2020-05-24 NOTE — Progress Notes (Signed)
Please refer to EMG and nerve conduction procedure note.  

## 2020-05-24 NOTE — Procedures (Signed)
     HISTORY:  Jeffery Ortiz is a 54 year old gentleman with a 3-year history of neck pain and left greater than right arm discomfort.  With chiropractic treatments, his right arm discomfort has improved significantly.  He continues to have left arm pain and paresthesias.  He is being evaluated for this issue.  NERVE CONDUCTION STUDIES:  Nerve conduction studies were performed on both upper extremities. The distal motor latencies and motor amplitudes for the median and ulnar nerves were within normal limits. The nerve conduction velocities for these nerves were also normal. The sensory latencies for the median and ulnar nerves were normal. The F wave latencies for the ulnar nerves were within normal limits.   EMG STUDIES:  EMG study was performed on the left upper extremity:  The first dorsal interosseous muscle reveals 2 to 4 K units with full recruitment. No fibrillations or positive waves were noted. The abductor pollicis brevis muscle reveals 2 to 4 K units with full recruitment. No fibrillations or positive waves were noted. The extensor indicis proprius muscle reveals 1 to 3 K units with full recruitment. No fibrillations or positive waves were noted. The pronator teres muscle reveals 2 to 3 K units with full recruitment. No fibrillations or positive waves were noted. The biceps muscle reveals 1 to 2 K units with full recruitment. No fibrillations or positive waves were noted. The triceps muscle reveals 2 to 4 K units with full recruitment. No fibrillations or positive waves were noted. The anterior deltoid muscle reveals 2 to 3 K units with full recruitment. No fibrillations or positive waves were noted. The cervical paraspinal muscles were tested at 2 levels. No abnormalities of insertional activity were seen at either level tested. There was poor relaxation.   IMPRESSION:  Nerve conduction studies done on both upper extremities were within normal limits.  No evidence of a  neuropathy was seen.  EMG evaluation of the left upper extremity was unremarkable, no evidence of an overlying cervical radiculopathy was seen.  Jill Alexanders MD 05/24/2020 4:15 PM  [facility] [address] [place], [place] [address]  Phone [phone] Fax [phone]

## 2020-05-25 NOTE — Progress Notes (Signed)
[facility]    Nerve / Sites Muscle Latency Ref. Amplitude Ref. Rel Amp Segments Distance Velocity Ref. Area    ms ms mV mV %  cm m/s m/s mVms  L Median - APB     Wrist APB 3.4 ?4.4 6.9 ?4.0 100 Wrist - APB 7   25.8     Upper arm APB 7.3  6.9  100 Upper arm - Wrist 22 56 ?49 25.3  R Median - APB     Wrist APB 3.6 ?4.4 8.4 ?4.0 100 Wrist - APB 7   27.2     Upper arm APB 7.5  8.1  96.5 Upper arm - Wrist 22 56 ?49 26.6  L Ulnar - ADM     Wrist ADM 2.9 ?3.3 11.1 ?6.0 100 Wrist - ADM 7   37.9     B.Elbow ADM 6.2  9.8  88.6 B.Elbow - Wrist 20 60 ?49 33.8     A.Elbow ADM 8.0  9.5  97.1 A.Elbow - B.Elbow 10 58 ?49 32.6  R Ulnar - ADM     Wrist ADM 3.1 ?3.3 10.7 ?6.0 100 Wrist - ADM 7   33.3     B.Elbow ADM 6.5  9.5  88.1 B.Elbow - Wrist 21 61 ?49 31.9     A.Elbow ADM 8.2  8.9  94.3 A.Elbow - B.Elbow 10 58 ?49 31.1             SNC    Nerve / Sites Rec. Site Peak Lat Ref.  Amp Ref. Segments Distance    ms ms V V  cm  L Median - Orthodromic (Dig II, Mid palm)     Dig II Wrist 2.8 ?3.4 14 ?10 Dig II - Wrist 13  R Median - Orthodromic (Dig II, Mid palm)     Dig II Wrist 2.8 ?3.4 15 ?10 Dig II - Wrist 13  L Ulnar - Orthodromic, (Dig V, Mid palm)     Dig V Wrist 2.7 ?3.1 5 ?5 Dig V - Wrist 11  R Ulnar - Orthodromic, (Dig V, Mid palm)     Dig V Wrist 2.6 ?3.1 7 ?5 Dig V - Wrist 62             F  Wave    Nerve F Lat Ref.   ms ms  L Ulnar - ADM 26.8 ?32.0  R Ulnar - ADM 29.3 ?32.0

## 2020-06-01 NOTE — Progress Notes (Deleted)
Assessment/Plan:   ***  Subjective:   Jeffery Ortiz was seen today in the movement disorders clinic for neurologic consultation at the request of Erline Levine, MD.  The consultation is for the evaluation of rest tremor.  Outside records that were made available to me were reviewed.  Dr Vertell Limber wanted to r/o Parkinsons Disease but didn't feel that the patient likely had it.  Sent here for evaluation.  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  Has cervical spondylosis for which he is scheduled sx tomorrow with Dr. Vertell Limber.   Pt previously evaluated at [facility] for tremor.  Records not available for review.  Tremor: {yes no:314532}   How long has it been going on? ***  At rest or with activation?  ***  When is it noted the most?  ***  Fam hx of tremor?  {yes [id number]}  Located where?  ***  Affected by caffeine:  {yes no:314532}  Affected by alcohol:  {yes no:314532}  Affected by stress:  {yes no:314532}  Affected by fatigue:  {yes no:314532}  Spills soup if on spoon:  {yes no:314532}  Spills glass of liquid if full:  {yes no:314532}  Affects ADL's (tying shoes, brushing teeth, etc):  {yes no:314532}  Tremor inducing meds:  {yes no:314532}  Other Specific Symptoms:  Voice: *** Sleep: ***  Vivid Dreams:  {yes no:314532}  Acting out dreams:  {yes no:314532} Wet Pillows: {yes no:314532} Postural symptoms:  {yes no:314532}  Falls?  {yes no:314532} Bradykinesia symptoms: {parkinson brady:18041} Loss of smell:  {yes no:314532} Loss of taste:  {yes no:314532} Urinary Incontinence:  {yes no:314532} Difficulty Swallowing:  {yes no:314532} Handwriting, micrographia: {yes no:314532} Trouble with ADL's:  {yes no:314532}  Trouble buttoning clothing: {yes no:314532} Depression:  {yes no:314532} Memory changes:  {yes no:314532} Hallucinations:  {yes no:314532}  visual distortions: {yes no:314532} N/V:  {yes no:314532} Lightheaded:   {yes no:314532}  Syncope: {yes no:314532} Diplopia:  {yes no:314532} Dyskinesia:  {yes no:314532}  Neuroimaging of the brain has *** previously been performed.  It *** available for my review today.  PREVIOUS MEDICATIONS: {Parkinson's RX:18200}  ALLERGIES:  No Known Allergies  CURRENT MEDICATIONS:  Current Outpatient Medications  Medication Instructions  . Multiple Vitamin (MULTIVITAMIN) tablet 1 tablet, Oral, Daily    Objective:   PHYSICAL EXAMINATION:    VITALS:  There were no vitals filed for this visit.  GEN:  The patient appears stated age and is in NAD. HEENT:  Normocephalic, atraumatic.  The mucous membranes are moist. The superficial temporal arteries are without ropiness or tenderness. CV:  RRR Lungs:  CTAB Neck/HEME:  There are no carotid bruits bilaterally.  Neurological examination:  Orientation: The patient is alert and oriented x3.  Cranial nerves: There is good facial symmetry.  Extraocular muscles are intact. The visual fields are full to confrontational testing. The speech is fluent and clear. Soft palate rises symmetrically and there is no tongue deviation. Hearing is intact to conversational tone. Sensation: Sensation is intact to light touch throughout (facial, trunk, extremities). Vibration is intact at the bilateral big toe. There is no extinction with double simultaneous stimulation.  Motor: Strength is 5/5 in the bilateral upper and lower extremities.   Shoulder shrug is equal and symmetric.  There is no pronator drift. Deep tendon reflexes: Deep tendon reflexes are 2/4 at the bilateral biceps, triceps, brachioradialis, patella and achilles. Plantar responses are downgoing bilaterally.  Movement examination: Tone: There is ***  tone in the bilateral upper extremities.  The tone in the lower extremities is ***.  Abnormal movements: *** Coordination:  There is *** decremation with RAM's, *** Gait and Station: The patient has *** difficulty arising out of a  deep-seated chair without the use of the hands. The patient's stride length is ***.  The patient has a *** pull test.     I have reviewed and interpreted the following labs independently   Chemistry      Component Value Date/Time   NA 139 08/26/2013 1625   K 3.8 08/26/2013 1625   CL 105 08/26/2013 1625   CO2 27 08/26/2013 1625   BUN 11 08/26/2013 1625   CREATININE 0.86 08/26/2013 1625      Component Value Date/Time   CALCIUM 9.0 08/26/2013 1625   ALKPHOS 64 03/25/2014 1117   AST 19 03/25/2014 1117   ALT 20 03/25/2014 1117   BILITOT 1.2 03/25/2014 1117      No results found for: TSH No results found for: WBC, HGB, HCT, MCV, PLT    Total time spent on today's visit was ***greater than 60 minutes, including both face-to-face time and nonface-to-face time.  Time included that spent on review of records (prior notes available to me/labs/imaging if pertinent), discussing treatment and goals, answering patient's questions and coordinating care.  Cc:  Leeanne Rio, MD

## 2020-06-02 ENCOUNTER — Encounter (HOSPITAL_COMMUNITY): Payer: Self-pay

## 2020-06-02 NOTE — Pre-Procedure Instructions (Signed)
[id number] [place], [place] - [place] [address] [place] [address] Phone: [phone] Fax: [phone]  [place], [place] [address] [place] [address] Phone: [phone] Fax: [phone]      Your procedure is scheduled on Tuesday August 17th.  Report to [facility] Main Entrance "A" at 10:30 A.M., and check in at the Admitting office.  Call this number if you have problems the morning of surgery:  [phone]  Call [phone] if you have any questions prior to your surgery date Monday-Friday 8am-4pm    Remember:  Do not eat or drink after midnight the night before your surgery   Take these medicines the morning of surgery with A SIP OF WATER -NONE  As of today, STOP taking any Aspirin (unless otherwise instructed by your surgeon) Aleve, Naproxen, Ibuprofen, Motrin, Advil, Goody's, BC's, all herbal medications, fish oil, and all vitamins.                      Do not wear jewelry, make up, or nail polish            Do not wear lotions, powders, perfumes/colognes, or deodorant.            Do not shave 48 hours prior to surgery.  Men may shave face and neck.            Do not bring valuables to the hospital.            [facility] is not responsible for any belongings or valuables.  Do NOT Smoke (Tobacco/Vaping) or drink Alcohol 24 hours prior to your procedure If you use a CPAP at night, you may bring all equipment for your overnight stay.   Contacts, glasses, dentures or bridgework may not be worn into surgery.      For patients admitted to the hospital, discharge time will be determined by your treatment team.   Patients discharged the day of surgery will not be allowed to drive home, and someone needs to stay with them for 24 hours.    Special instructions:   [place]- Preparing For Surgery  Before surgery, you can play an important role. Because skin is not  sterile, your skin needs to be as free of germs as possible. You can reduce the number of germs on your skin by washing with CHG (chlorahexidine gluconate) Soap before surgery.  CHG is an antiseptic cleaner which kills germs and bonds with the skin to continue killing germs even after washing.    Oral Hygiene is also important to reduce your risk of infection.  Remember - BRUSH YOUR TEETH THE MORNING OF SURGERY WITH YOUR REGULAR TOOTHPASTE  Please do not use if you have an allergy to CHG or antibacterial soaps. If your skin becomes reddened/irritated stop using the CHG.  Do not shave (including legs and underarms) for at least 48 hours prior to first CHG shower. It is OK to shave your face.  Please follow these instructions carefully.   1. Shower the NIGHT BEFORE SURGERY and the MORNING OF SURGERY with CHG Soap.   2. If you chose to wash your hair, wash your hair first as usual with your normal shampoo.  3. After you shampoo, rinse your hair and body thoroughly to remove the shampoo.  4. Use CHG as you would any other liquid soap. You can apply  CHG directly to the skin and wash gently with a scrungie or a clean washcloth.   5. Apply the CHG Soap to your body ONLY FROM THE NECK DOWN.  Do not use on open wounds or open sores. Avoid contact with your eyes, ears, mouth and genitals (private parts). Wash Face and genitals (private parts)  with your normal soap.   6. Wash thoroughly, paying special attention to the area where your surgery will be performed.  7. Thoroughly rinse your body with warm water from the neck down.  8. DO NOT shower/wash with your normal soap after using and rinsing off the CHG Soap.  9. Pat yourself dry with a CLEAN TOWEL.  10. Wear CLEAN PAJAMAS to bed the night before surgery  11. Place CLEAN SHEETS on your bed the night of your first shower and DO NOT SLEEP WITH PETS.   Day of Surgery: Wear Clean/Comfortable clothing the morning of surgery Do not apply any  deodorants/lotions.   Remember to brush your teeth WITH YOUR REGULAR TOOTHPASTE.   Please read over the following fact sheets that you were given.

## 2020-06-03 ENCOUNTER — Other Ambulatory Visit: Payer: Self-pay

## 2020-06-03 ENCOUNTER — Encounter (HOSPITAL_COMMUNITY): Payer: Self-pay

## 2020-06-03 ENCOUNTER — Other Ambulatory Visit (HOSPITAL_COMMUNITY)
Admission: RE | Admit: 2020-06-03 | Discharge: 2020-06-03 | Disposition: A | Discharge status: Home / Self Care | Source: Ambulatory Visit

## 2020-06-03 ENCOUNTER — Encounter (HOSPITAL_COMMUNITY)
Admission: RE | Admit: 2020-06-03 | Discharge: 2020-06-03 | Disposition: A | Discharge status: Home / Self Care | Source: Ambulatory Visit | Attending: Neurosurgery | Admitting: Neurosurgery

## 2020-06-03 DIAGNOSIS — Z01812 Encounter for preprocedural laboratory examination: Secondary | ICD-10-CM | POA: Insufficient documentation

## 2020-06-03 DIAGNOSIS — Z20822 Contact with and (suspected) exposure to covid-19: Secondary | ICD-10-CM | POA: Insufficient documentation

## 2020-06-03 LAB — CBC
HCT: 42.6 % (ref 39.0–52.0)
Hemoglobin: 14.3 g/dL (ref 13.0–17.0)
MCH: 31.4 pg (ref 26.0–34.0)
MCHC: 33.6 g/dL (ref 30.0–36.0)
MCV: 93.6 fL (ref 80.0–100.0)
Platelets: 178 10*3/uL (ref 150–400)
RBC: 4.55 MIL/uL (ref 4.22–5.81)
RDW: 12 % (ref 11.5–15.5)
WBC: 5.9 10*3/uL (ref 4.0–10.5)
nRBC: 0 % (ref 0.0–0.2)

## 2020-06-03 LAB — SURGICAL PCR SCREEN
MRSA, PCR: NEGATIVE
Staphylococcus aureus: NEGATIVE

## 2020-06-03 LAB — TYPE AND SCREEN
ABO/RH(D): A POS
Antibody Screen: NEGATIVE

## 2020-06-03 LAB — SARS CORONAVIRUS 2 (TAT 6-24 HRS): SARS Coronavirus 2: NEGATIVE

## 2020-06-03 NOTE — Progress Notes (Signed)
PCP - Dr. Huel Cote Cardiologist - pt denies   Chest x-ray - n/a EKG - n/a Stress Test - pt denies ECHO - pt denies Cardiac Cath - pt denies    ERAS Protcol - n/a PRE-SURGERY Ensure or G2- no  COVID TEST- 06/03/20  Coronavirus Screening  Have you experienced the following symptoms:  Cough yes/no: No Fever (>100.85F)  yes/no: No Runny nose yes/no: No Sore throat yes/no: No Difficulty breathing/shortness of breath  yes/no: No  Have you or a family member traveled in the last 14 days and where? yes/no: No   If the patient indicates "YES" to the above questions, their PAT will be rescheduled to limit the exposure to others and, the surgeon will be notified. THE PATIENT WILL NEED TO BE ASYMPTOMATIC FOR 14 DAYS.   If the patient is not experiencing any of these symptoms, the PAT nurse will instruct them to NOT bring anyone with them to their appointment since they may have these symptoms or traveled as well.   Please remind your patients and families that hospital visitation restrictions are in effect and the importance of the restrictions.     Anesthesia review: n/a  Patient denies shortness of breath, fever, cough and chest pain at PAT appointment   All instructions explained to the patient, with a verbal understanding of the material. Patient agrees to go over the instructions while at home for a better understanding. Patient also instructed to self quarantine after being tested for COVID-19. The opportunity to ask questions was provided.

## 2020-06-06 ENCOUNTER — Ambulatory Visit: Admitting: Neurology

## 2020-06-07 ENCOUNTER — Ambulatory Visit (HOSPITAL_COMMUNITY): Payer: No Typology Code available for payment source | Admitting: Vascular Surgery

## 2020-06-07 ENCOUNTER — Encounter (HOSPITAL_COMMUNITY)
Admission: RE | Disposition: A | Discharge status: Home / Self Care | Payer: Self-pay | Source: Home / Self Care | Attending: Neurosurgery

## 2020-06-07 ENCOUNTER — Ambulatory Visit (HOSPITAL_COMMUNITY): Payer: No Typology Code available for payment source

## 2020-06-07 ENCOUNTER — Observation Stay (HOSPITAL_COMMUNITY)
Admission: RE | Admit: 2020-06-07 | Discharge: 2020-06-08 | Disposition: A | Discharge status: Home / Self Care | Payer: No Typology Code available for payment source | Attending: Neurosurgery | Admitting: Neurosurgery

## 2020-06-07 ENCOUNTER — Encounter (HOSPITAL_COMMUNITY): Payer: Self-pay | Admitting: Neurosurgery

## 2020-06-07 ENCOUNTER — Other Ambulatory Visit: Payer: Self-pay

## 2020-06-07 DIAGNOSIS — M50121 Cervical disc disorder at C4-C5 level with radiculopathy: Secondary | ICD-10-CM | POA: Insufficient documentation

## 2020-06-07 DIAGNOSIS — Z87891 Personal history of nicotine dependence: Secondary | ICD-10-CM | POA: Diagnosis not present

## 2020-06-07 DIAGNOSIS — M5020 Other cervical disc displacement, unspecified cervical region: Secondary | ICD-10-CM | POA: Diagnosis present

## 2020-06-07 DIAGNOSIS — M4802 Spinal stenosis, cervical region: Secondary | ICD-10-CM | POA: Diagnosis not present

## 2020-06-07 DIAGNOSIS — Z419 Encounter for procedure for purposes other than remedying health state, unspecified: Secondary | ICD-10-CM

## 2020-06-07 DIAGNOSIS — M542 Cervicalgia: Secondary | ICD-10-CM | POA: Diagnosis present

## 2020-06-07 HISTORY — PX: ANTERIOR CERVICAL DECOMP/DISCECTOMY FUSION: SHX1161

## 2020-06-07 LAB — ABO/RH: ABO/RH(D): A POS

## 2020-06-07 SURGERY — ANTERIOR CERVICAL DECOMPRESSION/DISCECTOMY FUSION 3 LEVELS
Anesthesia: General

## 2020-06-07 MED ORDER — SUGAMMADEX SODIUM 200 MG/2ML IV SOLN
INTRAVENOUS | Status: DC | PRN
  Administered 2020-06-07: 160 mg via INTRAVENOUS

## 2020-06-07 MED ORDER — ACETAMINOPHEN 650 MG RE SUPP: 650.0000 mg | RECTAL | Status: DC | PRN

## 2020-06-07 MED ORDER — DOCUSATE SODIUM 100 MG PO CAPS
100.0000 mg | ORAL_CAPSULE | Freq: Two times a day (BID) | ORAL | Status: DC
  Administered 2020-06-07: 100 mg via ORAL
  Filled 2020-06-07: qty 1

## 2020-06-07 MED ORDER — CEFAZOLIN SODIUM-DEXTROSE 2-4 GM/100ML-% IV SOLN
2.0000 g | INTRAVENOUS | Status: AC
  Administered 2020-06-07: 2 g via INTRAVENOUS

## 2020-06-07 MED ORDER — ONDANSETRON HCL 4 MG/2ML IJ SOLN
INTRAMUSCULAR | Status: AC
  Filled 2020-06-07: qty 2

## 2020-06-07 MED ORDER — ROCURONIUM BROMIDE 10 MG/ML (PF) SYRINGE
PREFILLED_SYRINGE | INTRAVENOUS | Status: DC | PRN
  Administered 2020-06-07: 20 mg via INTRAVENOUS
  Administered 2020-06-07: 60 mg via INTRAVENOUS
  Administered 2020-06-07: 20 mg via INTRAVENOUS

## 2020-06-07 MED ORDER — PHENOL 1.4 % MT LIQD: 1.0000 | OROMUCOSAL | Status: DC | PRN

## 2020-06-07 MED ORDER — HYDROCODONE-ACETAMINOPHEN 5-325 MG PO TABS: 1.0000 | ORAL_TABLET | ORAL | Status: DC | PRN

## 2020-06-07 MED ORDER — FLEET ENEMA 7-19 GM/118ML RE ENEM: 1.0000 | ENEMA | Freq: Once | RECTAL | Status: DC | PRN

## 2020-06-07 MED ORDER — LACTATED RINGERS IV SOLN: INTRAVENOUS | Status: DC

## 2020-06-07 MED ORDER — ORAL CARE MOUTH RINSE: 15.0000 mL | Freq: Once | OROMUCOSAL | Status: AC

## 2020-06-07 MED ORDER — BUPIVACAINE HCL (PF) 0.5 % IJ SOLN
INTRAMUSCULAR | Status: AC
  Filled 2020-06-07: qty 30

## 2020-06-07 MED ORDER — BUPIVACAINE HCL (PF) 0.5 % IJ SOLN
INTRAMUSCULAR | Status: DC | PRN
  Administered 2020-06-07: 5 mL

## 2020-06-07 MED ORDER — PANTOPRAZOLE SODIUM 40 MG IV SOLR: 40.0000 mg | Freq: Every day | INTRAVENOUS | Status: DC

## 2020-06-07 MED ORDER — CEFAZOLIN SODIUM-DEXTROSE 2-4 GM/100ML-% IV SOLN
2.0000 g | Freq: Three times a day (TID) | INTRAVENOUS | Status: AC
  Administered 2020-06-07 – 2020-06-08 (×2): 2 g via INTRAVENOUS
  Filled 2020-06-07 (×2): qty 100

## 2020-06-07 MED ORDER — LIDOCAINE 2% (20 MG/ML) 5 ML SYRINGE
INTRAMUSCULAR | Status: AC
  Filled 2020-06-07: qty 5

## 2020-06-07 MED ORDER — FENTANYL CITRATE (PF) 100 MCG/2ML IJ SOLN: 25.0000 ug | INTRAMUSCULAR | Status: DC | PRN

## 2020-06-07 MED ORDER — OXYCODONE HCL 5 MG PO TABS: 5.0000 mg | ORAL_TABLET | ORAL | Status: DC | PRN

## 2020-06-07 MED ORDER — SODIUM CHLORIDE 0.9% FLUSH
3.0000 mL | Freq: Two times a day (BID) | INTRAVENOUS | Status: DC
  Administered 2020-06-07: 3 mL via INTRAVENOUS

## 2020-06-07 MED ORDER — DEXAMETHASONE SODIUM PHOSPHATE 10 MG/ML IJ SOLN
INTRAMUSCULAR | Status: DC | PRN
  Administered 2020-06-07: 10 mg via INTRAVENOUS

## 2020-06-07 MED ORDER — LIDOCAINE-EPINEPHRINE 1 %-1:100000 IJ SOLN
INTRAMUSCULAR | Status: AC
  Filled 2020-06-07: qty 1

## 2020-06-07 MED ORDER — PROMETHAZINE HCL 25 MG/ML IJ SOLN: 6.2500 mg | INTRAMUSCULAR | Status: DC | PRN

## 2020-06-07 MED ORDER — CHLORHEXIDINE GLUCONATE 0.12 % MT SOLN: 15.0000 mL | Freq: Once | OROMUCOSAL | Status: AC

## 2020-06-07 MED ORDER — CHLORHEXIDINE GLUCONATE 0.12 % MT SOLN
OROMUCOSAL | Status: AC
  Administered 2020-06-07: 15 mL via OROMUCOSAL
  Filled 2020-06-07: qty 15

## 2020-06-07 MED ORDER — 0.9 % SODIUM CHLORIDE (POUR BTL) OPTIME
TOPICAL | Status: DC | PRN
  Administered 2020-06-07: 1000 mL

## 2020-06-07 MED ORDER — ADULT MULTIVITAMIN W/MINERALS CH
1.0000 | ORAL_TABLET | Freq: Every day | ORAL | Status: DC
  Administered 2020-06-07: 1 via ORAL
  Filled 2020-06-07: qty 1

## 2020-06-07 MED ORDER — CEFAZOLIN SODIUM-DEXTROSE 2-4 GM/100ML-% IV SOLN
INTRAVENOUS | Status: AC
  Filled 2020-06-07: qty 100

## 2020-06-07 MED ORDER — METHOCARBAMOL 1000 MG/10ML IJ SOLN
500.0000 mg | Freq: Four times a day (QID) | INTRAVENOUS | Status: DC | PRN
  Filled 2020-06-07: qty 5

## 2020-06-07 MED ORDER — MIDAZOLAM HCL 2 MG/2ML IJ SOLN
INTRAMUSCULAR | Status: AC
  Filled 2020-06-07: qty 2

## 2020-06-07 MED ORDER — ROCURONIUM BROMIDE 10 MG/ML (PF) SYRINGE
PREFILLED_SYRINGE | INTRAVENOUS | Status: AC
  Filled 2020-06-07: qty 10

## 2020-06-07 MED ORDER — CHLORHEXIDINE GLUCONATE CLOTH 2 % EX PADS: 6.0000 | MEDICATED_PAD | Freq: Once | CUTANEOUS | Status: DC

## 2020-06-07 MED ORDER — POLYETHYLENE GLYCOL 3350 17 G PO PACK: 17.0000 g | PACK | Freq: Every day | ORAL | Status: DC | PRN

## 2020-06-07 MED ORDER — ACETAMINOPHEN 325 MG PO TABS
650.0000 mg | ORAL_TABLET | ORAL | Status: DC | PRN
  Administered 2020-06-07: 650 mg via ORAL
  Filled 2020-06-07: qty 2

## 2020-06-07 MED ORDER — BISACODYL 10 MG RE SUPP: 10.0000 mg | Freq: Every day | RECTAL | Status: DC | PRN

## 2020-06-07 MED ORDER — SODIUM CHLORIDE 0.9% FLUSH: 3.0000 mL | INTRAVENOUS | Status: DC | PRN

## 2020-06-07 MED ORDER — ONDANSETRON HCL 4 MG/2ML IJ SOLN: 4.0000 mg | Freq: Four times a day (QID) | INTRAMUSCULAR | Status: DC | PRN

## 2020-06-07 MED ORDER — MENTHOL 3 MG MT LOZG: 1.0000 | LOZENGE | OROMUCOSAL | Status: DC | PRN

## 2020-06-07 MED ORDER — PROPOFOL 10 MG/ML IV BOLUS
INTRAVENOUS | Status: DC | PRN
  Administered 2020-06-07: 150 mg via INTRAVENOUS

## 2020-06-07 MED ORDER — LIDOCAINE-EPINEPHRINE 1 %-1:100000 IJ SOLN
INTRAMUSCULAR | Status: DC | PRN
  Administered 2020-06-07: 5 mL

## 2020-06-07 MED ORDER — PHENYLEPHRINE 40 MCG/ML (10ML) SYRINGE FOR IV PUSH (FOR BLOOD PRESSURE SUPPORT)
PREFILLED_SYRINGE | INTRAVENOUS | Status: AC
  Filled 2020-06-07: qty 20

## 2020-06-07 MED ORDER — PHENYLEPHRINE 40 MCG/ML (10ML) SYRINGE FOR IV PUSH (FOR BLOOD PRESSURE SUPPORT)
PREFILLED_SYRINGE | INTRAVENOUS | Status: DC | PRN
  Administered 2020-06-07: 120 ug via INTRAVENOUS

## 2020-06-07 MED ORDER — ACETAMINOPHEN 500 MG PO TABS
1000.0000 mg | ORAL_TABLET | Freq: Once | ORAL | Status: AC
  Administered 2020-06-07: 1000 mg via ORAL
  Filled 2020-06-07: qty 2

## 2020-06-07 MED ORDER — SODIUM CHLORIDE 0.9 % IV SOLN: 250.0000 mL | INTRAVENOUS | Status: DC

## 2020-06-07 MED ORDER — MIDAZOLAM HCL 2 MG/2ML IJ SOLN
INTRAMUSCULAR | Status: DC | PRN
  Administered 2020-06-07: 2 mg via INTRAVENOUS

## 2020-06-07 MED ORDER — PHENYLEPHRINE HCL-NACL 10-0.9 MG/250ML-% IV SOLN
INTRAVENOUS | Status: DC | PRN
  Administered 2020-06-07: 25 ug/min via INTRAVENOUS

## 2020-06-07 MED ORDER — ONDANSETRON HCL 4 MG/2ML IJ SOLN
INTRAMUSCULAR | Status: DC | PRN
  Administered 2020-06-07: 4 mg via INTRAVENOUS

## 2020-06-07 MED ORDER — PANTOPRAZOLE SODIUM 40 MG PO TBEC
40.0000 mg | DELAYED_RELEASE_TABLET | Freq: Every day | ORAL | Status: DC
  Administered 2020-06-07: 40 mg via ORAL
  Filled 2020-06-07 (×2): qty 1

## 2020-06-07 MED ORDER — PROPOFOL 1000 MG/100ML IV EMUL
INTRAVENOUS | Status: AC
  Filled 2020-06-07: qty 100

## 2020-06-07 MED ORDER — METHOCARBAMOL 500 MG PO TABS: 500.0000 mg | ORAL_TABLET | Freq: Four times a day (QID) | ORAL | Status: DC | PRN

## 2020-06-07 MED ORDER — HYDROMORPHONE HCL 1 MG/ML IJ SOLN: 0.5000 mg | INTRAMUSCULAR | Status: DC | PRN

## 2020-06-07 MED ORDER — SUCCINYLCHOLINE CHLORIDE 200 MG/10ML IV SOSY
PREFILLED_SYRINGE | INTRAVENOUS | Status: AC
  Filled 2020-06-07: qty 10

## 2020-06-07 MED ORDER — ONDANSETRON HCL 4 MG PO TABS: 4.0000 mg | ORAL_TABLET | Freq: Four times a day (QID) | ORAL | Status: DC | PRN

## 2020-06-07 MED ORDER — FENTANYL CITRATE (PF) 250 MCG/5ML IJ SOLN
INTRAMUSCULAR | Status: AC
  Filled 2020-06-07: qty 5

## 2020-06-07 MED ORDER — LIDOCAINE 2% (20 MG/ML) 5 ML SYRINGE
INTRAMUSCULAR | Status: DC | PRN
  Administered 2020-06-07: 40 mg via INTRAVENOUS

## 2020-06-07 MED ORDER — PROPOFOL 10 MG/ML IV BOLUS
INTRAVENOUS | Status: AC
  Filled 2020-06-07: qty 20

## 2020-06-07 MED ORDER — ZOLPIDEM TARTRATE 5 MG PO TABS: 5.0000 mg | ORAL_TABLET | Freq: Every evening | ORAL | Status: DC | PRN

## 2020-06-07 MED ORDER — ALUM & MAG HYDROXIDE-SIMETH 200-200-20 MG/5ML PO SUSP: 30.0000 mL | Freq: Four times a day (QID) | ORAL | Status: DC | PRN

## 2020-06-07 MED ORDER — THROMBIN 5000 UNITS EX SOLR
OROMUCOSAL | Status: DC | PRN
  Administered 2020-06-07: 5 mL via TOPICAL

## 2020-06-07 MED ORDER — KCL IN DEXTROSE-NACL 20-5-0.45 MEQ/L-%-% IV SOLN: INTRAVENOUS | Status: DC

## 2020-06-07 MED ORDER — THROMBIN 5000 UNITS EX SOLR
CUTANEOUS | Status: AC
  Filled 2020-06-07: qty 5000

## 2020-06-07 MED ORDER — FENTANYL CITRATE (PF) 100 MCG/2ML IJ SOLN
INTRAMUSCULAR | Status: DC | PRN
  Administered 2020-06-07: 50 ug via INTRAVENOUS
  Administered 2020-06-07: 100 ug via INTRAVENOUS

## 2020-06-07 MED ORDER — DEXAMETHASONE SODIUM PHOSPHATE 10 MG/ML IJ SOLN
INTRAMUSCULAR | Status: AC
  Filled 2020-06-07: qty 1

## 2020-06-07 SURGICAL SUPPLY — 71 items
BAND RUBBER #18 3X1/16 STRL (MISCELLANEOUS) ×6 IMPLANT
BASKET BONE COLLECTION (BASKET) ×6 IMPLANT
BIT DRILL 14X2.5XNS TI ANT (BIT) ×1 IMPLANT
BIT DRILL AVIATOR 14 (BIT) ×1
BIT DRILL AVIATOR 14MM (BIT) ×1
BIT DRILL NEURO 2X3.1 SFT TUCH (MISCELLANEOUS) ×1 IMPLANT
BIT DRL 14X2.5XNS TI ANT (BIT) ×1
BLADE ULTRA TIP 2M (BLADE) IMPLANT
BNDG GAUZE ELAST 4 BULKY (GAUZE/BANDAGES/DRESSINGS) IMPLANT
BUR BARREL STRAIGHT FLUTE 4.0 (BURR) ×3 IMPLANT
CANISTER SUCT 3000ML PPV (MISCELLANEOUS) ×3 IMPLANT
CARTRIDGE OIL MAESTRO DRILL (MISCELLANEOUS) ×1 IMPLANT
COVER MAYO STAND STRL (DRAPES) ×3 IMPLANT
COVER WAND RF STERILE (DRAPES) ×3 IMPLANT
DECANTER SPIKE VIAL GLASS SM (MISCELLANEOUS) ×3 IMPLANT
DERMABOND ADVANCED (GAUZE/BANDAGES/DRESSINGS) ×2
DERMABOND ADVANCED .7 DNX12 (GAUZE/BANDAGES/DRESSINGS) ×1 IMPLANT
DIFFUSER DRILL AIR PNEUMATIC (MISCELLANEOUS) ×3 IMPLANT
DRAIN JACKSON PRATT 10MM FLAT (MISCELLANEOUS) ×3 IMPLANT
DRAPE HALF SHEET 40X57 (DRAPES) IMPLANT
DRAPE LAPAROTOMY 100X72 PEDS (DRAPES) ×3 IMPLANT
DRAPE MICROSCOPE LEICA (MISCELLANEOUS) ×3 IMPLANT
DRILL NEURO 2X3.1 SOFT TOUCH (MISCELLANEOUS) ×3
DRSG OPSITE POSTOP 4X6 (GAUZE/BANDAGES/DRESSINGS) ×3 IMPLANT
DURAPREP 6ML APPLICATOR 50/CS (WOUND CARE) ×3 IMPLANT
ELECT COATED BLADE 2.86 ST (ELECTRODE) ×3 IMPLANT
ELECT REM PT RETURN 9FT ADLT (ELECTROSURGICAL) ×3
ELECTRODE REM PT RTRN 9FT ADLT (ELECTROSURGICAL) ×1 IMPLANT
EVACUATOR SILICONE 100CC (DRAIN) ×3 IMPLANT
GAUZE 4X4 16PLY RFD (DISPOSABLE) IMPLANT
GAUZE SPONGE 4X4 12PLY STRL (GAUZE/BANDAGES/DRESSINGS) IMPLANT
GLOVE BIO SURGEON STRL SZ8 (GLOVE) ×3 IMPLANT
GLOVE BIOGEL PI IND STRL 8 (GLOVE) ×1 IMPLANT
GLOVE BIOGEL PI IND STRL 8.5 (GLOVE) ×1 IMPLANT
GLOVE BIOGEL PI INDICATOR 8 (GLOVE) ×2
GLOVE BIOGEL PI INDICATOR 8.5 (GLOVE) ×2
GLOVE ECLIPSE 8.0 STRL XLNG CF (GLOVE) ×3 IMPLANT
GLOVE EXAM NITRILE XL STR (GLOVE) IMPLANT
GLOVE SURG SS PI 7.0 STRL IVOR (GLOVE) ×3 IMPLANT
GLOVE SURG SS PI 8.0 STRL IVOR (GLOVE) ×3 IMPLANT
GOWN STRL REUS W/ TWL LRG LVL3 (GOWN DISPOSABLE) IMPLANT
GOWN STRL REUS W/ TWL XL LVL3 (GOWN DISPOSABLE) ×2 IMPLANT
GOWN STRL REUS W/TWL 2XL LVL3 (GOWN DISPOSABLE) ×6 IMPLANT
GOWN STRL REUS W/TWL LRG LVL3 (GOWN DISPOSABLE)
GOWN STRL REUS W/TWL XL LVL3 (GOWN DISPOSABLE) ×4
HALTER HD/CHIN CERV TRACTION D (MISCELLANEOUS) ×3 IMPLANT
HEMOSTAT POWDER KIT SURGIFOAM (HEMOSTASIS) ×3 IMPLANT
KIT BASIN OR (CUSTOM PROCEDURE TRAY) ×3 IMPLANT
KIT TURNOVER KIT B (KITS) ×3 IMPLANT
NEEDLE HYPO 25X1 1.5 SAFETY (NEEDLE) ×3 IMPLANT
NEEDLE SPNL 18GX3.5 QUINCKE PK (NEEDLE) ×3 IMPLANT
NEEDLE SPNL 22GX3.5 QUINCKE BK (NEEDLE) ×3 IMPLANT
NS IRRIG 1000ML POUR BTL (IV SOLUTION) ×3 IMPLANT
OIL CARTRIDGE MAESTRO DRILL (MISCELLANEOUS) ×3
PACK LAMINECTOMY NEURO (CUSTOM PROCEDURE TRAY) ×3 IMPLANT
PAD ARMBOARD 7.5X6 YLW CONV (MISCELLANEOUS) IMPLANT
PEEK SPACER AVS AS 6X14X16 4D (Cage) ×6 IMPLANT
PEEK SPACER AVS AS 7X14X16X4% (Peek) ×3 IMPLANT
PIN DISTRACTION 14MM (PIN) ×9 IMPLANT
PLATE AVIATOR ASSY 3LVL SZ 51 (Plate) ×3 IMPLANT
PUTTY DBX 1CC (Putty) ×3 IMPLANT
PUTTY DBX 1CC DEPUY (Putty) ×1 IMPLANT
SCREW AVIATOR VAR SELFTAP 4X14 (Screw) ×24 IMPLANT
SPONGE INTESTINAL PEANUT (DISPOSABLE) ×3 IMPLANT
SPONGE SURGIFOAM ABS GEL 100 (HEMOSTASIS) IMPLANT
STAPLER SKIN PROX WIDE 3.9 (STAPLE) ×3 IMPLANT
SUT VIC AB 3-0 SH 8-18 (SUTURE) ×6 IMPLANT
TOWEL GREEN STERILE (TOWEL DISPOSABLE) ×3 IMPLANT
TOWEL GREEN STERILE FF (TOWEL DISPOSABLE) ×3 IMPLANT
TRAY FOLEY MTR SLVR 16FR STAT (SET/KITS/TRAYS/PACK) IMPLANT
WATER STERILE IRR 1000ML POUR (IV SOLUTION) ×3 IMPLANT

## 2020-06-07 NOTE — Progress Notes (Signed)
Awake, alert, conversant.  MAEW with full bilateral D/B/T/HI.  No numbness.  Appropriate degree of neck soreness.

## 2020-06-07 NOTE — Anesthesia Preprocedure Evaluation (Addendum)
Anesthesia Evaluation  Patient identified by MRN, date of birth, ID band Patient awake    Reviewed: Allergy & Precautions, NPO status , Patient's Chart, lab work & pertinent test results  Airway Mallampati: II  TM Distance: >3 FB Neck ROM: Limited    Dental  (+) Dental Advisory Given, Poor Dentition   Pulmonary former smoker,    Pulmonary exam normal breath sounds clear to auscultation       Cardiovascular Exercise Tolerance: Good negative cardio ROS Normal cardiovascular exam Rhythm:Regular Rate:Normal     Neuro/Psych Herniated nucleus pulposus, Cervical LUE paresthesias     GI/Hepatic negative GI ROS, Neg liver ROS,   Endo/Other  negative endocrine ROS  Renal/GU negative Renal ROS     Musculoskeletal  (+) Arthritis ,   Abdominal   Peds  Hematology negative hematology ROS (+)   Anesthesia Other Findings Day of surgery medications reviewed with the patient.  H/o laryngeal cancer s/p chemo/radiation   Reproductive/Obstetrics                            Anesthesia Physical Anesthesia Plan  ASA: II  Anesthesia Plan: General   Post-op Pain Management:    Induction: Intravenous  PONV Risk Score and Plan: 3 and Midazolam, Ondansetron and Dexamethasone  Airway Management Planned: Oral ETT and Video Laryngoscope Planned  Additional Equipment:   Intra-op Plan:   Post-operative Plan: Extubation in OR  Informed Consent: I have reviewed the patients History and Physical, chart, labs and discussed the procedure including the risks, benefits and alternatives for the proposed anesthesia with the patient or authorized representative who has indicated his/her understanding and acceptance.     Dental advisory given  Plan Discussed with: CRNA  Anesthesia Plan Comments:         Anesthesia Quick Evaluation

## 2020-06-07 NOTE — Transfer of Care (Signed)
Immediate Anesthesia Transfer of Care Note  Patient: Jeffery Ortiz  Procedure(s) Performed: Cervical Four-Five Cervical Five-Six Cervical Six-Seven  Anterior cervical decompression/discectomy/fusion (N/A )  Patient Location: PACU  Anesthesia Type:General  Level of Consciousness: awake  Airway & Oxygen Therapy: Patient Spontanous Breathing  Post-op Assessment: Report given to RN and Patient moving all extremities X 4  Post vital signs: Reviewed and stable  Last Vitals:  Vitals Value Taken Time  BP    Temp    Pulse 73 06/07/20 1606  Resp 14 06/07/20 1606  SpO2 97 % 06/07/20 1606  Vitals shown include unvalidated device data.  Last Pain:  Vitals:   06/07/20 1125  TempSrc:   PainSc: 6       Patients Stated Pain Goal: 2 (02/58/52 7782)  Complications: No complications documented.

## 2020-06-07 NOTE — Interval H&P Note (Signed)
History and Physical Interval Note:  06/07/2020 1:05 PM  Jeffery Ortiz  has presented today for surgery, with the diagnosis of Herniated nucleus pulposus, Cervical.  The various methods of treatment have been discussed with the patient and family. After consideration of risks, benefits and other options for treatment, the patient has consented to  Procedure(s) with comments: Cervical 4-5 Cervical 5-6 Cervical 6-7 Anterior cervical decompression/discectomy/fusion (N/A) - 3C as a surgical intervention.  The patient's history has been reviewed, patient examined, no change in status, stable for surgery.  I have reviewed the patient's chart and labs.  Questions were answered to the patient's satisfaction.     Peggyann Shoals

## 2020-06-07 NOTE — Op Note (Signed)
06/07/2020  4:00 PM  PATIENT:  Jeffery Ortiz  54 y.o. male  PRE-OPERATIVE DIAGNOSIS:  Herniated nucleus pulposus, Cervical, cervical stenosis, cervicalgia, radiculopathy C 45, C 56, C 67 levels  POST-OPERATIVE DIAGNOSIS:  Herniated nucleus pulposus, Cervical, cervical stenosis, cervicalgia, radiculopathy C 45, C 56, C 67 levels  PROCEDURE:  Procedure(s): Cervical Four-Five Cervical Five-Six Cervical Six-Seven  Anterior cervical decompression/discectomy/fusion (N/A) with PEEK cages, autograft, DBM, plate  SURGEON:  Surgeon(s) and Role:    Erline Levine, MD - Primary    * Dawley, Theodoro Doing, DO - Assisting  PHYSICIAN ASSISTANT: Glenford Peers, NP  ASSISTANTS: Poteat, RN   ANESTHESIA:   general  EBL:  25 mL   BLOOD ADMINISTERED:none  DRAINS: (#10) Jackson-Pratt drain(s) with closed bulb suction in the prevertebral space   LOCAL MEDICATIONS USED:  MARCAINE    and LIDOCAINE   SPECIMEN:  No Specimen  DISPOSITION OF SPECIMEN:  N/A  COUNTS:  YES  TOURNIQUET:  * No tourniquets in log *  DICTATION: Patient was brought to operating room and following the smooth and uncomplicated induction of general endotracheal anesthesia his head was placed on a horseshoe head holder he was placed in 5 pounds of Holter traction and his anterior neck was prepped and draped in usual sterile fashion. An incision was made on the left side of midline after infiltrating the skin and subcutaneous tissues with local lidocaine. The platysmal layer was incised and subplatysmal dissection was performed exposing the anterior border sternocleidomastoid muscle. Using blunt dissection the carotid sheath was kept lateral and trachea and esophagus kept medial exposing the anterior cervical spine. The prevertebral fascia was thickened and reactive, due to prior neck radiation.  A bent spinal needle was placed it was felt to be the C4-5  level and this was confirmed on intraoperative x-ray. Longus coli muscles were taken down from  the anterior cervical spine using electrocautery and key elevator and self-retaining retractor was placed. The interspace at C6-7 was incised and a thorough discectomy was performed. Distraction pins were placed. Uncinate spurs and central spondylitic ridges were drilled down with a high-speed drill. The spinal cord dura and both C7 nerve roots were widely decompressed. Hemostasis was assured. After trial sizing a 7 mm peek interbody cage was selected and packed with local autograft and DBM. The graft was tamped into position and countersunk appropriately. The retractor was moved and the interspace at C5-6 was incised and a thorough discectomy was performed. Distraction pins were placed. Uncinate spurs and central spondylitic ridges were drilled down with a high-speed drill. The spinal cord dura and both C6 nerve roots were widely decompressed. Hemostasis was assured. After trial sizing a 6 mm peek interbody cage was selected and packed in a similar fashion. The graft was tamped into position and countersunk appropriately.The interspace at C4-5 was incised and a thorough discectomy was performed. Distraction pins were placed. Uncinate spurs and central spondylitic ridges were drilled down with a high-speed drill. The spinal cord dura and both C5 nerve roots were widely decompressed. A large osteophyte was removed on the right with decompression of the left C5 nerve root. Hemostasis was assured. After trial sizing a 6 mm peek interbody cage was selected and packed in a similar fashion. The graft was tamped into position and countersunk appropriately.  Distraction weight was removed. A 51 mm Aviator anterior cervical plate was affixed to the cervical spine with 14 mm variable-angle screws 2 at C4, 2 at C5, 2 at C6,  and 2  at C7. All screws were well-positioned and locking mechanisms were engaged. Soft tissues were inspected and found to be in good repair. The wound was irrigated. A final x-ray was obtained with good  visualization at C4 to C 6 with the interbody grafts well visualized. A #10 JP drain was inserted through a separate stab incision.  The platysma layer was closed with 3-0 Vicryl stitches and the skin was reapproximated with 3-0 Vicryl subcuticular stitches. The wound was dressed with Dermabond and an occlusive dressing. Counts were correct at the end of the case. Patient was extubated and taken to recovery in stable and satisfactory condition.    PLAN OF CARE: Admit for overnight observation  PATIENT DISPOSITION:  PACU - hemodynamically stable.   Delay start of Pharmacological VTE agent (>24hrs) due to surgical blood loss or risk of bleeding: yes

## 2020-06-07 NOTE — Anesthesia Procedure Notes (Signed)
Procedure Name: Intubation Date/Time: 06/07/2020 1:25 PM Performed by: Barrington Ellison, CRNA Pre-anesthesia Checklist: Patient identified, Emergency Drugs available, Suction available, Patient being monitored and Timeout performed Patient Re-evaluated:Patient Re-evaluated prior to induction Oxygen Delivery Method: Circle System Utilized Preoxygenation: Pre-oxygenation with 100% oxygen Induction Type: IV induction Ventilation: Mask ventilation without difficulty Laryngoscope Size: Mac and 3 Grade View: Grade I Tube type: Oral Number of attempts: 1 Airway Equipment and Method: Stylet,  Oral airway,  Rigid stylet and Video-laryngoscopy Placement Confirmation: ETT inserted through vocal cords under direct vision,  positive ETCO2 and breath sounds checked- equal and bilateral Secured at: 22 cm Tube secured with: Tape Dental Injury: Teeth and Oropharynx as per pre-operative assessment  Comments: Glide used d/t patient limited neck mobility, placed by Loleta Books SRNA

## 2020-06-07 NOTE — Anesthesia Postprocedure Evaluation (Signed)
Anesthesia Post Note  Patient: ONOFRIO KLEMP  Procedure(s) Performed: Cervical Four-Five Cervical Five-Six Cervical Six-Seven  Anterior cervical decompression/discectomy/fusion (N/A )     Patient location during evaluation: PACU Anesthesia Type: General Level of consciousness: awake and alert, awake and oriented Pain management: pain level controlled Vital Signs Assessment: post-procedure vital signs reviewed and stable Respiratory status: spontaneous breathing, nonlabored ventilation, respiratory function stable and patient connected to nasal cannula oxygen Cardiovascular status: blood pressure returned to baseline and stable Postop Assessment: no apparent nausea or vomiting Anesthetic complications: no   No complications documented.  Last Vitals:  Vitals:   06/07/20 1651 06/07/20 1717  BP: 116/77 125/85  Pulse: 70 66  Resp: 11 18  Temp: 36.4 C 36.5 C  SpO2: 97% 99%    Last Pain:  Vitals:   06/07/20 1717  TempSrc: Oral  PainSc:                  Catalina Gravel

## 2020-06-07 NOTE — H&P (Signed)
Patient ID:   [id number] Patient: Jeffery Ortiz  Date of Birth: 24-Apr-1966 Visit Type: Office Visit   Date: 05/04/2020 09:15 AM Provider: Marchia Meiers. Vertell Limber MD   This 54 year old male presents for neck pain.  HISTORY OF PRESENT ILLNESS: 1.  neck pain  Jeffery Ortiz, 54 year old retired male, visits for evaluation.  Patient notes left greater than right arm pain and numbness for 2-3 years.  Initially, patient found chiropractic treatments helpful for right upper extremity symptoms; however, symptoms of increased involving both upper extremities over the past 2 years.  Chiropractic treatments offered some relief initially Physical therapy offered no relief 10s unit offered no relief Exercise offers no relief  History:  Tremor hands and lower extremities ([facility] physician aware but no diagnosis offered), anxiety, laryngeal cancer 2 years ago treated with radiation and chemotherapy Surgical history:  Vasectomy 1996, bunionectomy on the left 2008, soft tissue release left foot 2008, right foot 2008  Cervical MRI (motion degraded due to tremor) 01/20/2020 uploaded canopy X-ray on canopy  The patient appears to have more tremor with rest than with activity.  He says that the pain is getting much worse in his left arm along with numbness into all of his fingertips.  He says a chiropractor was helpful for his right upper extremity pain but did not give him any relief his left upper extremity.  He describes the pain as miserable and unrelenting and wants to get some relief.  Cervical MRI does show some motion degradation due to tremor but it also shows significant left-sided foraminal narrowing at the C4-5 level along with C5-6 and bilateral C6-7 foraminal stenosis.  The patient had prior right-sided laryngeal cancer.  He has been told that his vocal cord is working fine.  Initially had a growth on his vocal cord.  He is going to see Dr. Pablo Ledger later this week who will do laryngoscopy and confirm  that his vocal cords are functioning without impairment of motion.       PAST MEDICAL/SURGICAL HISTORY:   (Detailed)      PAST MEDICAL HISTORY, SURGICAL HISTORY, FAMILY HISTORY, SOCIAL HISTORY AND REVIEW OF SYSTEMS I have reviewed the patient's past medical, surgical, family and social history as well as the comprehensive review of systems as included on the [facility] history form dated 04/27/2020, which I have signed.  Family History:  (Detailed)   Social History:  (Detailed) Tobacco use reviewed. Preferred language is [place].   Smoking status: Former smoker.  SMOKING STATUS Type Smoking Status Usage Per Day Years Used Total Pack Years  Former smoker         MEDICATIONS: (added, continued or stopped this visit)   ALLERGIES: Ingredient Reaction Medication Name Comment NO KNOWN ALLERGIES    No known allergies. Reviewed, updated.   REVIEW OF SYSTEMS  See scanned patient registration form, dated 04/27/2020, signed and dated on 05/05/2020  Review of Systems Details System Neg/Pos Details Constitutional Negative Chills, Fatigue, Fever, Malaise, Night sweats, Weight gain and Weight loss. ENMT Negative Ear drainage, Hearing loss, Nasal drainage, Otalgia, Sinus pressure and Sore throat. Eyes Negative Eye discharge, Eye pain and Vision changes. Respiratory Negative Chronic cough, Cough, Dyspnea, Known TB exposure and Wheezing. Cardio Negative Chest pain, Claudication, Edema and Irregular heartbeat/palpitations. GI Negative Abdominal pain, Blood in stool, Change in stool pattern, Constipation, Decreased appetite, Diarrhea, Heartburn, Nausea and Vomiting. GU Negative Dribbling, Dysuria, Erectile dysfunction, Hematuria, Polyuria (Genitourinary), Slow stream, Urinary frequency, Urinary incontinence and Urinary  retention. Endocrine Negative Cold intolerance, Heat intolerance, Polydipsia and  Polyphagia. Neuro Positive Numbness in extremity, Tremors. Psych Positive Anxiety. Integumentary Negative Brittle hair, Brittle nails, Change in shape/size of mole(s), Hair loss, Hirsutism, Hives, Pruritus, Rash and Skin lesion. MS Positive Left greater than right arm pain. Hema/Lymph Negative Easy bleeding, Easy bruising and Lymphadenopathy. Allergic/Immuno Negative Contact allergy, Environmental allergies, Food allergies and Seasonal allergies. Reproductive Negative Penile discharge and Sexual dysfunction.  PHYSICAL EXAM:  Vitals Date Temp F BP Pulse Ht In Wt Lb BMI BSA Pain Score 05/04/2020  118/74 66 69 172.6 25.49  7/10   PHYSICAL EXAM Details General Level of Distress: no acute distress Overall Appearance: normal  Head and Face  Right Left  Fundoscopic Exam:  normal normal    Cardiovascular Cardiac: regular rate and rhythm without murmur  Right Left  Carotid Pulses: normal normal  Respiratory Lungs: clear to auscultation  Neurological Orientation: normal Recent and Remote Memory: normal Attention Span and Concentration:   normal Language: normal Fund of Knowledge: normal  Right Left Sensation: normal normal Upper Extremity Coordination: normal normal  Lower Extremity Coordination: normal normal  Musculoskeletal Gait and Station: normal  Right Left Upper Extremity Muscle Strength: normal normal Lower Extremity Muscle Strength: normal normal Upper Extremity Muscle Tone:  normal normal Lower Extremity Muscle Tone: normal normal   Motor Strength Upper and lower extremity motor strength was tested in the clinically pertinent muscles. Any abnormal findings will be noted below.   Right Left Deltoid:  4+/5 Biceps:  4/5 Triceps:  4/5   Deep Tendon Reflexes  Right Left Biceps: normal normal Triceps: normal normal Brachioradialis: normal normal Patellar: normal normal Achilles: normal normal  Sensory Sensation was  tested at C2 to T1.   Cranial Nerves II. Optic Nerve/Visual Fields: normal III. Oculomotor: normal IV. Trochlear: normal V. Trigeminal: normal VI. Abducens: normal VII. Facial: normal VIII. Acoustic/Vestibular: normal IX. Glossopharyngeal: normal X. Vagus: normal XI. Spinal Accessory: normal XII. Hypoglossal: normal  Motor and other Tests Lhermittes: negative Rhomberg: negative Pronator drift: absent     Right Left Spurlings negative positive Hoffman's: normal normal Clonus: normal normal Babinski: normal normal SLR: negative negative Patrick's Corky Sox): negative negative Toe Walk: normal normal Toe Lift: normal normal Heel Walk: normal normal Tinels Elbow: negative negative Tinels Wrist: negative positive Phalen: negative positive   Additional Findings:  She tremor is more at rest than with activity.  There are fasciculations in his right upper extremity.  I did not see facial or tongue fasciculations.    IMPRESSION:  The patient has a significant rest tremor without cogwheeling rigidity.  I told him that this may be parkinsonism but I do not think it is.  I am going to send him to Dr. Carles Collet for an evaluation of his tremor.  He has significant cervical spondylosis and biforaminal stenosis with left arm pain and weakness.  His MRI shows left-sided C4-5, bilateral C5-6, bilateral C6-7 narrowing.  The MRI report was suggestive of C7-T1 narrowing but to my review this is not terribly severe.  The patient also has numbness in his left hand greater than right.  He has positive Tinel sign and Phalen signs suggestive of carpal tunnel syndrome.  I have asked for EMG/NCV testing of both upper extremities.  PLAN: The patient will need a 3 level anterior cervical decompression fusion surgery.  Additionally he is being evaluated for carpal tunnel syndrome.  He will see his radiation oncologist to assess vocal cord function and if she wants his input  she will send into the ENT  doctor.  He will see neurologist for evaluation of tremor.  Patient wishes to proceed with neck surgery as soon as possible.  Risks and benefits of surgery discussed in detail with patient and he wishes to proceed.  Orders: Diagnostic Procedures: Assessment Procedure M54.12 Cervical Spine- AP/Lat/Flex/Ex M54.12 Cervical Spine- Lateral R20.0 Bilateral Upper Extremity EMG/NCV with Dr. Jaynee Eagles Instruction(s)/Education: Assessment Instruction [phone] Dietary management education, guidance, and counseling Miscellaneous: Assessment  M50.20 Hard Cervical Collar  Completed Orders (this encounter) Order Details Reason Side Interpretation Result Initial Treatment Date Region Cervical Spine- AP/Lat/Flex/Ex      05/04/2020 All Levels to All Levels Dietary management education, guidance, and counseling Encouraged patient to eat well balanced diet.        Assessment/Plan  # Detail Type Description  1. Assessment Cervical radiculopathy (M54.12).     2. Assessment Tremor (R25.1).  Plan Orders Neurology Referral to [facility]. Clinical information/comments: tremor.     3. Assessment Cervical stenosis of spinal canal (M48.02).     4. Assessment Herniated nucleus pulposus, cervical (M50.20).  Plan Orders Hard Cervical Collar.     5. Assessment Left upper extremity numbness (R20.0).  Plan Orders Further diagnostic evaluations ordered today include(s) Bilateral Upper Extremity EMG/NCV with Dr. Jaynee Eagles to be performed.     6. Assessment Body mass index (BMI) 25.0-25.9, adult (U23.53).  Plan Orders Today's instructions / counseling include(s) Dietary management education, guidance, and counseling. Clinical information/comments: Encouraged patient to eat well balanced diet.       Pain Management Plan Pain Scale: 7/10. Method: Numeric Pain Intensity Scale. Location: neck. Onset: 05/04/2020. Duration: varies. Quality:  discomforting. Pain management follow-up plan of care: Patient will continue medication management..              Provider:  Marchia Meiers. Vertell Limber MD  05/05/2020 02:28 PM    Dictation edited by: Marchia Meiers. Vertell Limber    CC Providers: Glade [address] [place],  [place]  [id number]-   Lewis Semel  [address] [place], [place] [address]-               Electronically signed by Marchia Meiers. Vertell Limber MD on 05/05/2020 02:28 PM

## 2020-06-07 NOTE — Brief Op Note (Signed)
06/07/2020  4:00 PM  PATIENT:  Jeffery Ortiz  54 y.o. male  PRE-OPERATIVE DIAGNOSIS:  Herniated nucleus pulposus, Cervical, cervical stenosis, cervicalgia, radiculopathy C 45, C 56, C 67 levels  POST-OPERATIVE DIAGNOSIS:  Herniated nucleus pulposus, Cervical, cervical stenosis, cervicalgia, radiculopathy C 45, C 56, C 67 levels  PROCEDURE:  Procedure(s): Cervical Four-Five Cervical Five-Six Cervical Six-Seven  Anterior cervical decompression/discectomy/fusion (N/A) with PEEK cages, autograft, DBM, plate  SURGEON:  Surgeon(s) and Role:    Erline Levine, MD - Primary    * Dawley, Theodoro Doing, DO - Assisting  PHYSICIAN ASSISTANT: Glenford Peers, NP  ASSISTANTS: Poteat, RN   ANESTHESIA:   general  EBL:  25 mL   BLOOD ADMINISTERED:none  DRAINS: (#10) Jackson-Pratt drain(s) with closed bulb suction in the prevertebral space   LOCAL MEDICATIONS USED:  MARCAINE    and LIDOCAINE   SPECIMEN:  No Specimen  DISPOSITION OF SPECIMEN:  N/A  COUNTS:  YES  TOURNIQUET:  * No tourniquets in log *  DICTATION: Patient was brought to operating room and following the smooth and uncomplicated induction of general endotracheal anesthesia his head was placed on a horseshoe head holder he was placed in 5 pounds of Holter traction and his anterior neck was prepped and draped in usual sterile fashion. An incision was made on the left side of midline after infiltrating the skin and subcutaneous tissues with local lidocaine. The platysmal layer was incised and subplatysmal dissection was performed exposing the anterior border sternocleidomastoid muscle. Using blunt dissection the carotid sheath was kept lateral and trachea and esophagus kept medial exposing the anterior cervical spine. The prevertebral fascia was thickened and reactive, due to prior neck radiation.  A bent spinal needle was placed it was felt to be the C4-5  level and this was confirmed on intraoperative x-ray. Longus coli muscles were taken down from  the anterior cervical spine using electrocautery and key elevator and self-retaining retractor was placed. The interspace at C6-7 was incised and a thorough discectomy was performed. Distraction pins were placed. Uncinate spurs and central spondylitic ridges were drilled down with a high-speed drill. The spinal cord dura and both C7 nerve roots were widely decompressed. Hemostasis was assured. After trial sizing a 7 mm peek interbody cage was selected and packed with local autograft and DBM. The graft was tamped into position and countersunk appropriately. The retractor was moved and the interspace at C5-6 was incised and a thorough discectomy was performed. Distraction pins were placed. Uncinate spurs and central spondylitic ridges were drilled down with a high-speed drill. The spinal cord dura and both C6 nerve roots were widely decompressed. Hemostasis was assured. After trial sizing a 6 mm peek interbody cage was selected and packed in a similar fashion. The graft was tamped into position and countersunk appropriately.The interspace at C4-5 was incised and a thorough discectomy was performed. Distraction pins were placed. Uncinate spurs and central spondylitic ridges were drilled down with a high-speed drill. The spinal cord dura and both C5 nerve roots were widely decompressed. A large osteophyte was removed on the right with decompression of the left C5 nerve root. Hemostasis was assured. After trial sizing a 6 mm peek interbody cage was selected and packed in a similar fashion. The graft was tamped into position and countersunk appropriately.  Distraction weight was removed. A 51 mm Aviator anterior cervical plate was affixed to the cervical spine with 14 mm variable-angle screws 2 at C4, 2 at C5, 2 at C6,  and 2  at C7. All screws were well-positioned and locking mechanisms were engaged. Soft tissues were inspected and found to be in good repair. The wound was irrigated. A final x-ray was obtained with good  visualization at C4 to C 6 with the interbody grafts well visualized. A #10 JP drain was inserted through a separate stab incision.  The platysma layer was closed with 3-0 Vicryl stitches and the skin was reapproximated with 3-0 Vicryl subcuticular stitches. The wound was dressed with Dermabond and an occlusive dressing. Counts were correct at the end of the case. Patient was extubated and taken to recovery in stable and satisfactory condition.    PLAN OF CARE: Admit for overnight observation  PATIENT DISPOSITION:  PACU - hemodynamically stable.   Delay start of Pharmacological VTE agent (>24hrs) due to surgical blood loss or risk of bleeding: yes

## 2020-06-08 ENCOUNTER — Encounter (HOSPITAL_COMMUNITY): Payer: Self-pay | Admitting: Neurosurgery

## 2020-06-08 DIAGNOSIS — M4802 Spinal stenosis, cervical region: Secondary | ICD-10-CM | POA: Diagnosis not present

## 2020-06-08 NOTE — Evaluation (Signed)
Occupational Therapy Evaluation Patient Details Name: Jeffery Ortiz MRN: [id number] DOB: June 28, 1966 Today's Date: 06/08/2020    History of Present Illness Pt is a 54 y/o male who presents s/p C4-C6 ACDF on 06/07/2020. PMH significant for laryngeal CA s/p chemo, B foot surgery (bone spurs and plantar fascia surgery).    Clinical Impression   Patient evaluated by Occupational Therapy with no further acute OT needs identified. All education has been completed and the patient has no further questions. See below for any follow-up Occupational Therapy or equipment needs. OT to sign off. Thank you for referral.      Follow Up Recommendations  No OT follow up    Equipment Recommendations  None recommended by OT    Recommendations for Other Services       Precautions / Restrictions Precautions Precautions: Cervical Precaution Booklet Issued: Yes (comment) Precaution Comments: reviewed adls with handout present in room Required Braces or Orthoses: Cervical Brace Cervical Brace: Hard collar      Mobility Bed Mobility Overal bed mobility: Independent                Transfers Overall transfer level: Independent                    Balance                                           ADL either performed or assessed with clinical judgement   ADL Overall ADL's : Modified independent                                       General ADL Comments: able to figure 4 cross, able to complete basic transfer, able to don doff brace mod I with wife presnt for education.   Cervical precautions ( handout provided): Educated patient on don doff brace with return demonstration, educated on oral care using cups, washing face with cloth, never to wash directly on incision site, avoid neck rotation flexion and extension, positioning with pillows in chair for bil UE, sleeping positioning, avoiding pushing / pulling with bil UE.  Pt educated on need to notify  doctor / RN of swallowing changes or choking..     Vision Baseline Vision/History: No visual deficits       Perception     Praxis      Pertinent Vitals/Pain Pain Assessment: No/denies pain     Hand Dominance Right   Extremity/Trunk Assessment Upper Extremity Assessment Upper Extremity Assessment: Overall WFL for tasks assessed (reports no pain and no weakness when asked)   Lower Extremity Assessment Lower Extremity Assessment: Defer to PT evaluation   Cervical / Trunk Assessment Cervical / Trunk Assessment: Other exceptions (s/p surg)   Communication Communication Communication: No difficulties   Cognition Arousal/Alertness: Awake/alert Behavior During Therapy: WFL for tasks assessed/performed Overall Cognitive Status: Within Functional Limits for tasks assessed                                     General Comments  drain in placed so not dressed for home    Exercises     Shoulder Instructions      Home Living Family/patient expects to be discharged to:: Private  residence Living Arrangements: Spouse/significant other Available Help at Discharge: Family Type of Home: House Home Access: Stairs to enter Technical brewer of Steps: 2         Bathroom Shower/Tub: Occupational psychologist: Handicapped height     [place]: [place] in   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, retail      Prior Functioning/Environment Level of Independence: Independent                 OT Problem List:        OT Treatment/Interventions:      OT Goals(Current goals can be found in the care plan section) Acute Rehab OT Goals Patient Stated Goal: to able to go home  OT Frequency:     Barriers to D/C:            Co-evaluation              AM-PAC OT "6 Clicks" Daily Activity     Outcome Measure Help from another person eating meals?: None Help from another person taking care of personal grooming?: None Help  from another person toileting, which includes using toliet, bedpan, or urinal?: None Help from another person bathing (including washing, rinsing, drying)?: None Help from another person to put on and taking off regular upper body clothing?: None Help from another person to put on and taking off regular lower body clothing?: None 6 Click Score: 24   End of Session Nurse Communication: Mobility status;Precautions  Activity Tolerance: Patient tolerated treatment well Patient left: Other (comment) (entering bathroom)  OT Visit Diagnosis: Unsteadiness on feet (R26.81)                Time: 6659-9357 OT Time Calculation (min): 23 min Charges:  OT General Charges $OT Visit: 1 Visit OT Evaluation $OT Eval Moderate Complexity: 1 Mod OT Treatments $Self Care/Home Management : 8-22 mins   Brynn, OTR/L  Acute Rehabilitation Services Pager: [phone] Office: [phone] .   Jeri Modena 06/08/2020, 10:53 AM

## 2020-06-08 NOTE — Progress Notes (Signed)
Subjective: Patient reports that he is doing well and has had a resolution of his BUE radicular pain. He reports mild neck soreness. He further reported ambulating in the hall last night without difficulty. No acute events were reported overnight. The patient feels as though he is ready for discharge. He would like to only take Tylenol for his pain at this time.   Objective: Vital signs in last 24 hours: Temp:  [97 F (36.1 C)-98.6 F (37 C)] 98.6 F (37 C) (08/18 0745) Pulse Rate:  [60-77] 72 (08/18 0745) Resp:  [11-20] 16 (08/18 0745) BP: (116-144)/(74-94) 120/79 (08/18 0745) SpO2:  [96 %-100 %] 100 % (08/18 0745) Weight:  [77.2 kg] 77.2 kg (08/17 1125)  Intake/Output from previous day: 08/17 0701 - 08/18 0700 In: 1400 [I.V.:1300; IV Piggyback:100] Out: 65 [Drains:40; Blood:25] Intake/Output this shift: No intake/output data recorded.  Physical Exam:   Patient is A/O X4, in good spirits, and is conversant. He has full strength in his bilateral upper extremities that is symmetric bilaterally, 5/5 deltoids, biceps, triceps, and hand intrinsics. Sensation is intact. Dressing is CDI. Incision is well approximated with no drainage, erythema, or edema. JP drain is present with 30 ml of sanguinous output overnight.   Lab Results: No results for input(s): WBC, HGB, HCT, PLT in the last 72 hours. BMET No results for input(s): NA, K, CL, CO2, GLUCOSE, BUN, CREATININE, CALCIUM in the last 72 hours.  Studies/Results: DG Cervical Spine 2-3 Views  Result Date: 06/07/2020 CLINICAL DATA:  ACDF EXAM: CERVICAL SPINE - 2-3 VIEW COMPARISON:  01/20/2020 FINDINGS: 3 lateral images of the cervical spine are obtained during the performance of procedure. Patient is intubated. Image 1 demonstrates instrumentation overlying the C3/C4 disc space. Image 2 demonstrates instrumentation overlying the C4/C5 disc space. The final image demonstrates C4-C7 ACDF with anterior fusion plate and intra corporal screws.  Laparotomy pad anterior to the fusion site. IMPRESSION: 1. Intraoperative exam as above, during C4-C7 ACDF. Electronically Signed   By: Randa Ngo M.D.   On: 06/07/2020 20:00    Assessment/Plan: Patient is recovering well from his surgery. He reports a resolution of his BUE radicular pain and only has complaints of mild neck soreness that appears to be appropriate at this point in his recovery. Continue hard cervical collar. Remove JP drain. Continue working on mobilization and ambulation. Work with PT/OT. Plan to discharge patient to home today.    LOS: 0 days     Jeffery Ortiz 06/08/2020, 8:49 AM

## 2020-06-08 NOTE — Plan of Care (Signed)
Pt doing well. Pt and wife given D/C instructions with verbal understanding. Pt's incision is clean and dry with no sign of infection. Pt's IV and JP drain were removed prior to D/C. Pt D/C'd home via wheelchair per MD order. Pt is stable @ D/C and has no other needs at this time. Holli Humbles, RN

## 2020-06-08 NOTE — Discharge Summary (Signed)
Physician Discharge Summary  Patient ID: Jeffery Ortiz MRN: [id number] DOB/AGE: [birth year] 54 y.o.  Admit date: 06/07/2020 Discharge date: 06/08/2020  Admission Diagnoses: Herniated nucleus pulposus, Cervical, cervical stenosis, cervicalgia, radiculopathy C 45, C 56, C 67 levels  Discharge Diagnoses: Herniated nucleus pulposus, Cervical, cervical stenosis, cervicalgia, radiculopathy C 45, C 56, C 67 levels Active Problems:   Herniated cervical disc without myelopathy   Discharged Condition: good  Hospital Course: The patient was diagnosed with a herniated nucleus pulposus, Cervical, cervical stenosis, cervicalgia, radiculopathy C 45, C 56, C 67 levels. It was elected to take him to surgery for a Cervical Four-Five Cervical Five-Six Cervical Six-Seven  Anterior cervical decompression/discectomy/fusion with PEEK cages, autograft, DBM, and plate. He underwent an uncomplicated surgery and was extubated in the OR. He was taken to PACU in stable condition for recovery. He was then transferred to [facility] for overnight observation. At this point he is stable and is ready to be discharged home.   Consults: None  Significant Diagnostic Studies: radiology: X- rays  Treatments: surgery: Cervical Four-Five Cervical Five-Six Cervical Six-Seven  Anterior cervical decompression/discectomy/fusion (N/A) with PEEK cages, autograft, DBM, plate  Discharge Exam: Blood pressure 120/79, pulse 72, temperature 98.6 F (37 C), temperature source Oral, resp. rate 16, height 5\' 9"  (1.753 m), weight 77.2 kg, SpO2 100 %. Physical Exam:  Patient is A/O X4, in good spirits, and is conversant. He has full strength in his bilateral upper extremities that is symmetric bilaterally, 5/5 deltoids, biceps, triceps, and hand intrinsics. Sensation is intact. Dressing is CDI. Incision is well approximated with no drainage, erythema, or edema.   Disposition: Discharge disposition: 01-Home or Self Care        Allergies as of  06/08/2020   No Known Allergies     Medication List    TAKE these medications   multivitamin tablet Take 1 tablet by mouth daily.        Signed: Marvis Moeller, DNP, NP-C 06/08/2020, 9:00 AM

## 2020-06-08 NOTE — Evaluation (Signed)
Physical Therapy Evaluation and Discharge Patient Details Name: CHRISTINE SCHIEFELBEIN MRN: [id number] DOB: 1966-05-21 Today's Date: 06/08/2020   History of Present Illness  Pt is a 54 y/o male who presents s/p C4-C6 ACDF on 06/07/2020. PMH significant for laryngeal CA s/p chemo, B foot surgery (bone spurs and plantar fascia surgery).     Clinical Impression  Patient evaluated by Physical Therapy with no further acute PT needs identified. All education has been completed and the patient has no further questions. Pt was able to demonstrate transfers and ambulation with gross modified independence to independence (no AD). Pt was educated on precautions, brace application/wearing schedule, appropriate activity progression, and car transfer. See below for any follow-up Physical Therapy or equipment needs. PT is signing off. Thank you for this referral.     Follow Up Recommendations No PT follow up;Supervision - Intermittent    Equipment Recommendations  None recommended by PT    Recommendations for Other Services       Precautions / Restrictions Precautions Precautions: Cervical Precaution Booklet Issued: Yes (comment) Precaution Comments: Provided handout, and pt was cued for precautions during functional mobility.  Required Braces or Orthoses: Cervical Brace Cervical Brace: Hard collar Restrictions Weight Bearing Restrictions: No      Mobility  Bed Mobility Overal bed mobility: Independent             General bed mobility comments: Pt demonstrated proper log roll technique with HOB flat and rails lowered to simulate home environment.   Transfers Overall transfer level: Independent Equipment used: None             General transfer comment: No assist required. Pt was able to easily transition to/from sitting position.   Ambulation/Gait Ambulation/Gait assistance: Modified independent (Device/Increase time) Gait Distance (Feet): 400 Feet Assistive device: None Gait  Pattern/deviations: WFL(Within Functional Limits) Gait velocity: Decreased Gait velocity interpretation: 1.31 - 2.62 ft/sec, indicative of limited community ambulator General Gait Details: Mildly decreased gait speed but otherwise steady and without overt LOB noted.   Stairs Stairs: Yes Stairs assistance: Modified independent (Device/Increase time) Stair Management: One rail Right;Alternating pattern;Forwards Number of Stairs: 10 General stair comments: No assist required. No unsteadiness noted.  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, retail    Prior Function Level of Independence: Independent               Hand Dominance   Dominant Hand: Right    Extremity/Trunk Assessment   Upper Extremity Assessment Upper Extremity Assessment: Overall WFL for tasks assessed (Reports back to baseline)    Lower Extremity Assessment Lower Extremity Assessment: Overall WFL for tasks assessed    Cervical / Trunk Assessment Cervical / Trunk Assessment: Other exceptions (s/p surg)  Communication   Communication: No difficulties  Cognition Arousal/Alertness: Awake/alert Behavior During Therapy:  WFL for tasks assessed/performed Overall Cognitive Status: Within Functional Limits for tasks assessed                                        General Comments General comments (skin integrity, edema, etc.): drain in  placed so not dressed for home    Exercises     Assessment/Plan    PT Assessment Patent does not need any further PT services  PT Problem List         PT Treatment Interventions      PT Goals (Current goals can be found in the Care Plan section)  Acute Rehab PT Goals Patient Stated Goal: to able to go home PT Goal Formulation: All assessment and education complete, DC therapy    Frequency     Barriers to discharge        Co-evaluation               AM-PAC PT "6 Clicks" Mobility  Outcome Measure Help needed turning from your back to your side while in a flat bed without using bedrails?: None Help needed moving from lying on your back to sitting on the side of a flat bed without using bedrails?: None Help needed moving to and from a bed to a chair (including a wheelchair)?: None Help needed standing up from a chair using your arms (e.g., wheelchair or bedside chair)?: None Help needed to walk in hospital room?: None Help needed climbing 3-5 steps with a railing? : None 6 Click Score: 24    End of Session Equipment Utilized During Treatment: Cervical collar Activity Tolerance: Patient tolerated treatment well Patient left: in chair;with call bell/phone within reach Nurse Communication: Mobility status PT Visit Diagnosis: Unsteadiness on feet (R26.81);Pain Pain - part of body:  (neck)    Time: 2334-3568 PT Time Calculation (min) (ACUTE ONLY): 18 min   Charges:   PT Evaluation $PT Eval Low Complexity: 1 Low          Rolinda Roan, PT, DPT Acute Rehabilitation Services Pager: [phone] Office: [phone]   Thelma Comp 06/08/2020, 12:16 PM

## 2020-06-17 NOTE — Progress Notes (Signed)
Assessment/Plan:   1.  Tremor  -Did not see them much tremor today, either rest or intention.  Patient states that it has actually gotten markedly better since cervical spine surgery.  Reassured him that I did not see evidence of Parkinson's disease today.  He did not meet [place] brain bank criteria for the diagnosis, or the modified MDS criteria.  I did not recommend any medication for tremor, especially given the fact that the patient stated that it is less bothersome for him and more bothersome for family.  I did, however, tell him that should things get worse or progress or new neurologic symptoms arise in the future, I would like to see him back.  Subjective:   Jeffery Ortiz was seen today in the movement disorders clinic for neurologic consultation at the request of Erline Levine, MD.  The consultation is for the evaluation of rest tremor.  Outside records that were made available to me were reviewed. This patient is accompanied in the office by his spouse who supplements the history. Dr Vertell Limber wanted to r/o Parkinsons Disease but didn't feel that the patient likely had it.  Sent here for evaluation.  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  Has cervical spondylosis for which he just had sx on 8/17.  Pt previously evaluated at [facility] med center for tremor.  Records not available for review.  Tremor: Yes.     How long has it been going on? At least since 2009  At rest or with activation?  Rest - pts wife states that he doesn't notice it but family does  When is it noted the most?  relaxing  Fam hx of tremor?  No.  Located where?  Bilateral UE and some in bilateral legs  Affected by caffeine:  No. (1 pot coffee)  Affected by alcohol:  No. (drinks 3-5 drinks/week - gin and tonic)  Affected by stress:  No.  Affected by fatigue:  No.  Spills soup if on spoon:  No.  Spills glass of liquid if full:  No.  Affects ADL's (tying shoes, brushing teeth,  etc):  No. - "if i'm focused on something, I generally can do it."  Tremor inducing meds:  No.  Other Specific Symptoms:  Voice: no changes Sleep:   Vivid Dreams:  Yes.    Acting out dreams:  Yes.  , occasionally Wet Pillows: No. Postural symptoms:  No.  Falls?  No. Bradykinesia symptoms: difficulty getting out of a chair (due to bad back per pt); no change in speed of movement or change in stride length Loss of smell:  No. Loss of taste:  No. Urinary Incontinence:  No. Difficulty Swallowing:  No. Handwriting, micrographia: No. Depression:  No. Memory changes: "not terrible but not like it once was" Hallucinations:  No.  visual distortions: No. N/V:  No. Lightheaded:  No.  Syncope: No. Diplopia:  No. Dyskinesia:  No.  Neuroimaging of the brain has not previously been performed.    PREVIOUS MEDICATIONS: none to date  ALLERGIES:  No Known Allergies  CURRENT MEDICATIONS:  Current Outpatient Medications  Medication Instructions  . Multiple Vitamin (MULTIVITAMIN) tablet 1 tablet, Oral, Daily    Objective:   PHYSICAL EXAMINATION:    VITALS:   Vitals:   06/21/20 1334  BP: 121/78  Pulse: 78  SpO2: 97%  Weight: 166 lb (75.3 kg)  Height: 5\' 9"  (1.753 m)    GEN:  The  patient appears stated age and is in NAD. HEENT:  Normocephalic, atraumatic.  The mucous membranes are moist. The superficial temporal arteries are without ropiness or tenderness. Neck: Patient in cervical collar (hard cervical collar)  Neurological examination:  Orientation: The patient is alert and oriented x3.  Cranial nerves: There is good facial symmetry.  Extraocular muscles are intact. The visual fields are full to confrontational testing. The speech is fluent and clear. Soft palate rises symmetrically and there is no tongue deviation. Hearing is intact to conversational tone. Motor: Strength is 5/5 in the bilateral upper and lower extremities.   Shoulder shrug is equal and symmetric.  There is no  pronator drift.   Movement examination: Tone: The patient had a lot of trouble relaxing, so much so that it was very difficult to test his tone. Abnormal movements: No rest tremor was noted when completely distracted.  There was some entrainment with postural tremor that went away with distraction.  There was no trouble with Archimedes spirals.  He did fairly well with pouring water from 1 glass to another. Coordination:  There is no decremation with RAM's, with any form of RAMS, including alternating supination and pronation of the forearm, hand opening and closing, finger taps, heel taps and toe taps. Gait and Station: The patient has no difficulty arising out of a deep-seated chair without the use of the hands. The patient's stride length is good.   I have reviewed and interpreted the following labs independently   Chemistry      Component Value Date/Time   NA 139 08/26/2013 1625   K 3.8 08/26/2013 1625   CL 105 08/26/2013 1625   CO2 27 08/26/2013 1625   BUN 11 08/26/2013 1625   CREATININE 0.86 08/26/2013 1625      Component Value Date/Time   CALCIUM 9.0 08/26/2013 1625   ALKPHOS 64 03/25/2014 1117   AST 19 03/25/2014 1117   ALT 20 03/25/2014 1117   BILITOT 1.2 03/25/2014 1117      No results found for: TSH Lab Results  Component Value Date   WBC 5.9 06/03/2020   HGB 14.3 06/03/2020   HCT 42.6 06/03/2020   MCV 93.6 06/03/2020   PLT 178 06/03/2020      Total time spent on today's visit was 45 minutes, including both face-to-face time and nonface-to-face time.  Time included that spent on review of records (prior notes available to me/labs/imaging if pertinent), discussing treatment and goals, answering patient's questions and coordinating care.  Cc:  Leeanne Rio, MD

## 2020-06-21 ENCOUNTER — Other Ambulatory Visit: Payer: Self-pay

## 2020-06-21 ENCOUNTER — Encounter: Payer: Self-pay | Admitting: Neurology

## 2020-06-21 ENCOUNTER — Ambulatory Visit (INDEPENDENT_AMBULATORY_CARE_PROVIDER_SITE_OTHER): Payer: No Typology Code available for payment source | Admitting: Neurology

## 2020-06-21 VITALS — BP 121/78 | HR 78 | Ht 69.0 in | Wt 166.0 lb

## 2020-06-21 DIAGNOSIS — R251 Tremor, unspecified: Secondary | ICD-10-CM

## 2020-06-21 NOTE — Patient Instructions (Signed)
I didn't see evidence today of Parkinsons Disease.  If your symptoms change or worsen over time, you can call me and make a follow up appointment.  It was good to see you today!  Tremor A tremor is trembling or shaking that you cannot control. Most tremors affect the hands or arms. Tremors can also affect the head, vocal cords, face, and other parts of the body. There are many types of tremors. Common types include:  Essential tremor. These usually occur in people older than 40. It may run in families and can happen in otherwise healthy people.  Resting tremor. These occur when the muscles are at rest, such as when your hands are resting in your lap. People with Parkinson's disease often have resting tremors.  Postural tremor. These occur when you try to hold a pose, such as keeping your hands outstretched.  Kinetic tremor. These occur during purposeful movement, such as trying to touch a finger to your nose.  Task-specific tremor. These may occur when you perform certain tasks such as writing, speaking, or standing.  Psychogenic tremor. These dramatically lessen or disappear when you are distracted. They can happen in people of all ages. Some types of tremors have no known cause. Tremors can also be a symptom of nervous system problems (neurological disorders) that may occur with aging. Some tremors go away with treatment, while others do not. Follow these instructions at home: Lifestyle      Limit alcohol intake to no more than 1 drink a day for nonpregnant women and 2 drinks a day for men. One drink equals 12 oz of beer, 5 oz of wine, or 1 oz of hard liquor.  Do not use any products that contain nicotine or tobacco, such as cigarettes and e-cigarettes. If you need help quitting, ask your health care provider.  Avoid extreme heat and extreme cold.  Limit your caffeine intake, as told by your health care provider.  Try to get 8 hours of sleep each night.  Find ways to manage your  stress, such as meditation or yoga. General instructions  Take over-the-counter and prescription medicines only as told by your health care provider.  Keep all follow-up visits as told by your health care provider. This is important. Contact a health care provider if you:  Develop a tremor after starting a new medicine.  Have a tremor along with other symptoms such as: ? Numbness. ? Tingling. ? Pain. ? Weakness.  Notice that your tremor gets worse.  Notice that your tremor interferes with your day-to-day life. Summary  A tremor is trembling or shaking that you cannot control.  Most tremors affect the hands or arms.  Some types of tremors have no known cause. Others may be a symptom of nervous system problems (neurological disorders).  Make sure you discuss any tremors you have with your health care provider. This information is not intended to replace advice given to you by your health care provider. Make sure you discuss any questions you have with your health care provider. Document Revised: 09/20/2017 Document Reviewed: 08/08/2017 Elsevier Patient Education  2020 [employer].

## 2020-12-24 IMAGING — CR DG CERVICAL SPINE 2 OR 3 VIEWS
3 series · 3 of 3 positions shown · non-contrast
Comparison: 01/20/2020

CLINICAL DATA: ACDF

EXAM:
CERVICAL SPINE - 2-3 VIEW

[xtable (1 of 3)]
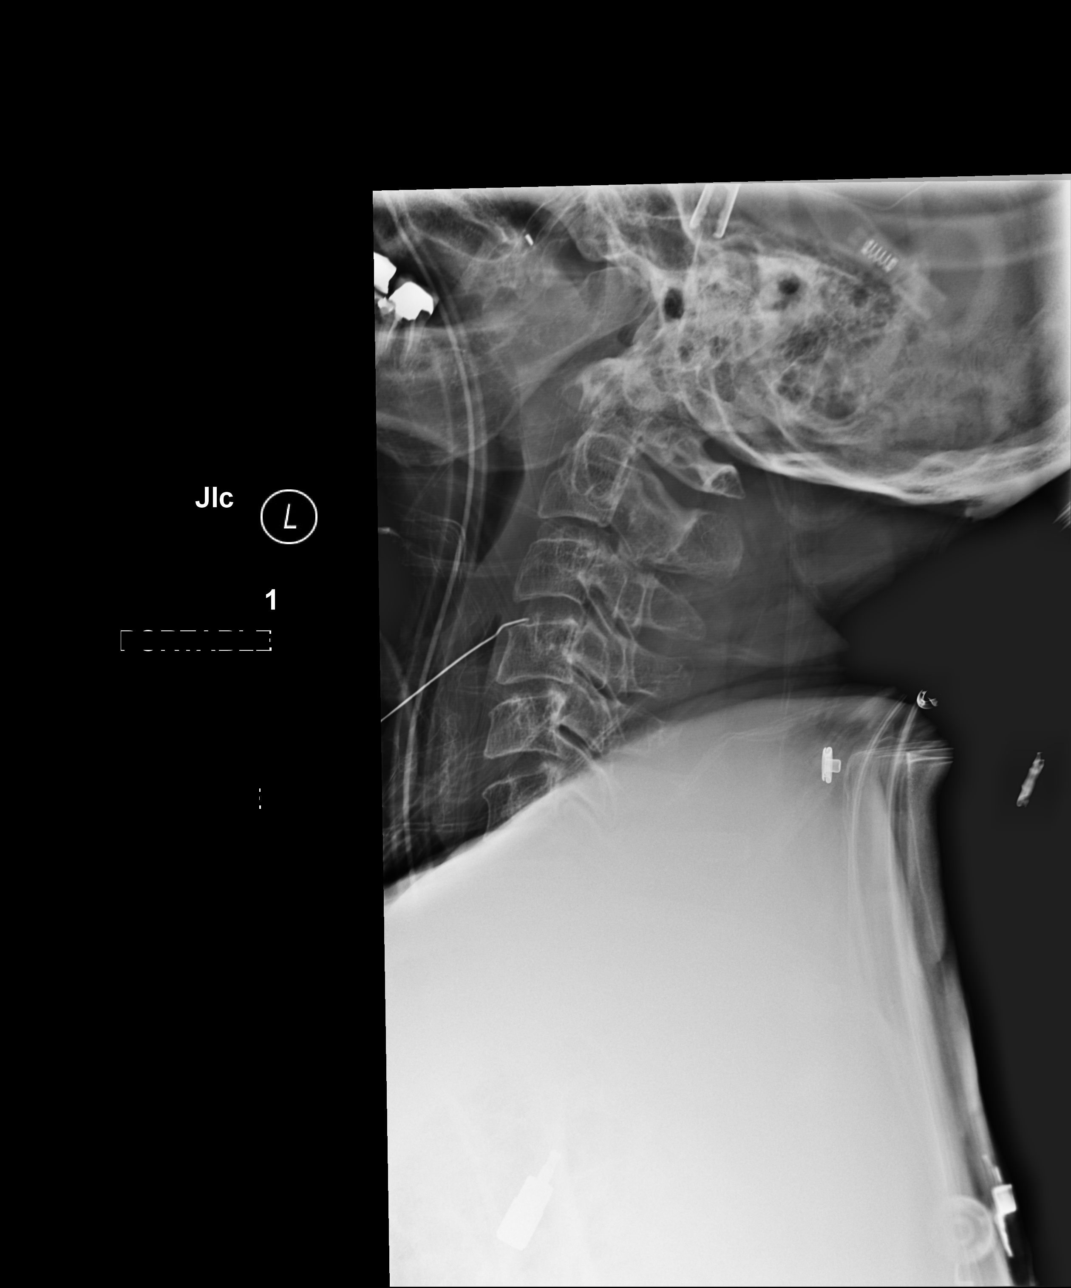

[xtable (2 of 3)]
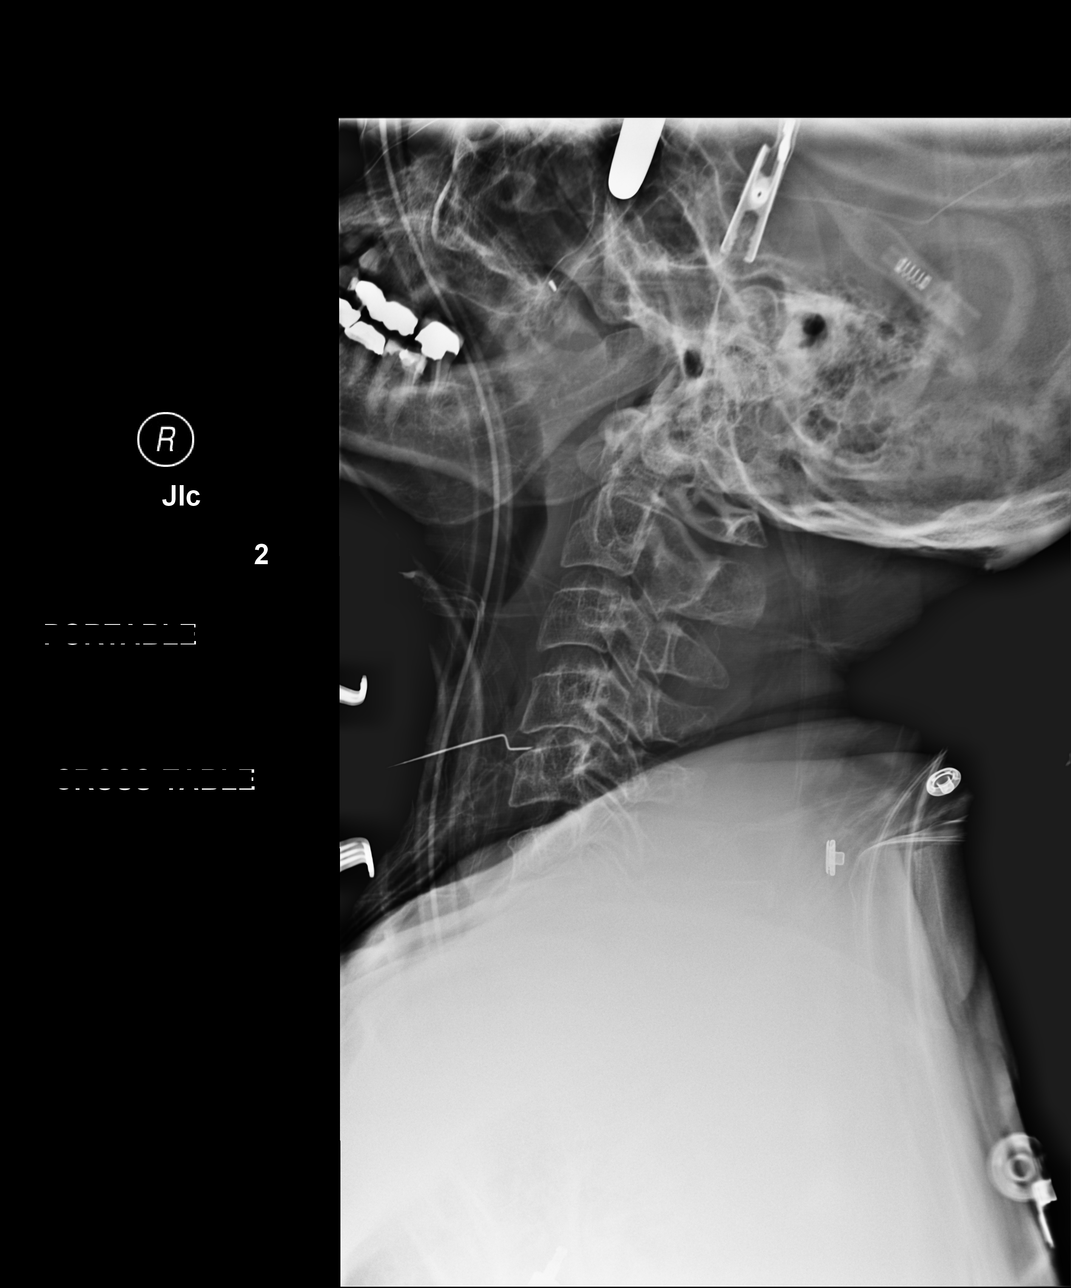

[xtable (3 of 3)]
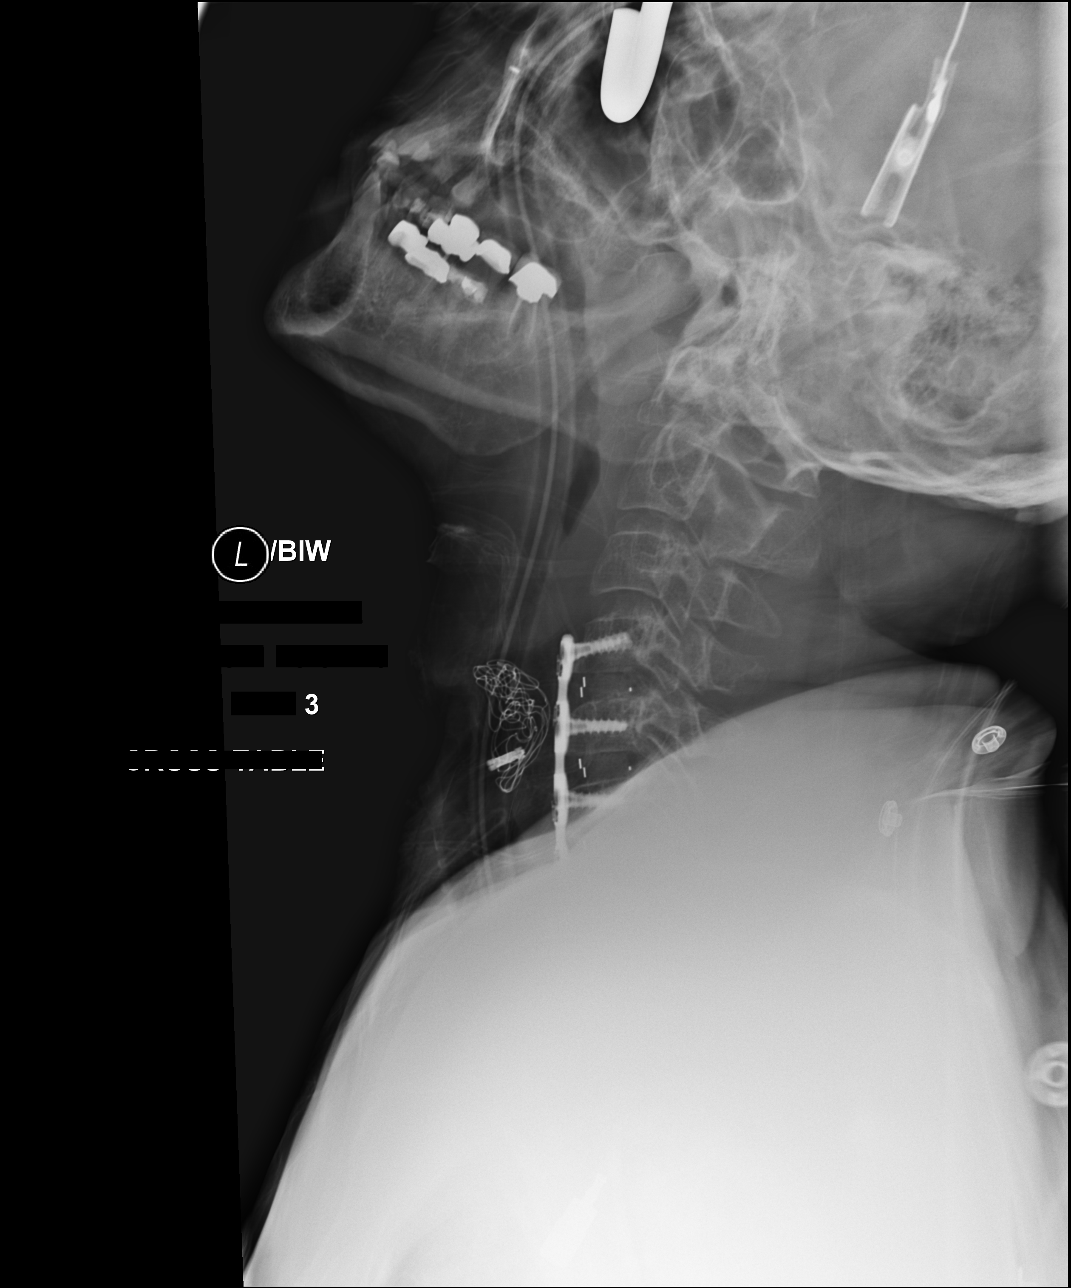

[3 of 3 positions shown; findings below may reference images not displayed]

FINDINGS: 3 lateral images of the cervical spine are obtained during the
performance of procedure. Patient is intubated. Image 1 demonstrates
instrumentation overlying the C3/C4 disc space. Image 2 demonstrates
instrumentation overlying the C4/C5 disc space. The final image
demonstrates C4-C7 ACDF with anterior fusion plate and intra
corporal screws. Laparotomy pad anterior to the fusion site.
IMPRESSION: 1. Intraoperative exam as above, during C4-C7 ACDF.

## 2024-09-24 ENCOUNTER — Encounter (INDEPENDENT_AMBULATORY_CARE_PROVIDER_SITE_OTHER): Payer: Self-pay

## 2024-10-27 ENCOUNTER — Encounter (INDEPENDENT_AMBULATORY_CARE_PROVIDER_SITE_OTHER): Payer: Self-pay | Admitting: Otolaryngology

## 2024-10-27 ENCOUNTER — Ambulatory Visit (INDEPENDENT_AMBULATORY_CARE_PROVIDER_SITE_OTHER): Admitting: Otolaryngology

## 2024-10-27 VITALS — BP 120/85 | HR 63 | Temp 98.2°F | Ht 69.0 in | Wt 175.0 lb

## 2024-10-27 DIAGNOSIS — Z8521 Personal history of malignant neoplasm of larynx: Secondary | ICD-10-CM | POA: Diagnosis not present

## 2024-10-27 NOTE — Progress Notes (Signed)
 Reason for Consult: Previous laryngeal cancer Referring Physician: Dr. Lang Alm Jeffery Ortiz is an 59 y.o. male.  HPI: History of laryngeal carcinoma and was treated with chemo and radiation 6 years ago.  He also has had some neck surgery recently for cervical issues.  He has done well with that.  He has not had any symptoms of laryngeal cancer.  He has no difficulty swallowing.  No hoarseness.  No pain.  He does have some mucus in the throat that is fairly intermittent.  He takes twice daily Protonix .  He gets chest x-rays each year.  He has no weight loss.  Past Medical History:  Diagnosis Date   Arthritis    Laryngeal cancer [facility])     Past Surgical History:  Procedure Laterality Date   ANTERIOR CERVICAL DECOMP/DISCECTOMY FUSION N/A 06/07/2020   Procedure: Cervical Four-Five Cervical Five-Six Cervical Six-Seven  Anterior cervical decompression/discectomy/fusion;  Surgeon: Unice Pac, MD;  Location: [facility] OR;  Service: Neurosurgery;  Laterality: N/A;   BUNIONECTOMY     FOOT SURGERY     x 4 (bone spurs)   PLANTAR FASCIA RELEASE     VASECTOMY      Family History  Problem Relation Age of Onset   Esophageal cancer Mother    Throat cancer Father    Lymphoma Child 24       b-cell lymphoma    Social History:  reports that he has quit smoking. He has never used smokeless tobacco. He reports current alcohol use. He reports that he does not use drugs.  Allergies: Allergies[1]   No results found for this or any previous visit (from the past 48 hours).  No results found.  ROS Blood pressure 120/85, pulse 63, temperature 98.2 F (36.8 C), height 5' 9 (1.753 m), weight 175 lb (79.4 kg), SpO2 98%. Physical Exam Constitutional:      Appearance: Normal appearance.  HENT:     Head: Normocephalic and atraumatic.     Right Ear: Tympanic membrane is without lesions and middle ear aerated, ear canal and external ear normal.     Left Ear: Tympanic membrane is without lesions and middle ear  aerated, ear canal and external ear normal.     Nose: Nose without deviation of septum.  Turbinates with mild hypertrophy, No significant swelling or masses.     Oral cavity/oropharynx: Mucous membranes are moist. No lesions or masses    Larynx: normal voice. Mirror attempted without success    Eyes:     Extraocular Movements: Extraocular movements intact.     Conjunctiva/sclera: Conjunctivae normal.     Pupils: Pupils are equal, round, and reactive to light.  Cardiovascular:     Rate and Rhythm: Normal rate.  Pulmonary:     Effort: Pulmonary effort is normal.  Musculoskeletal:     Cervical back: Normal range of motion and neck supple. No rigidity.  Lymphadenopathy:     Cervical: No cervical adenopathy or masses.salivary glands without lesions. .     Salivary glands- no mass or swelling Neurological:     Mental Status: He is alert. CN 2-12 intact. No nystagmus  Flexible fibroptic laryngoscopy  Patient was informed of risks, benefits, and options. All questions answered. Consent obtained.   The scope was passed through the nose and tracked into the nasopharynx. The nasopharynx without lesions or masses. The scope was positioned over the base of tongue and epiglottis. There was no obvious lesions or significant swelling and any of the laryngeal or pharyngeal structures.  Just a slight amount of swelling of the arytenoids consistent with radiation but overall everything looks excellent.  The vocal cords move well. The subglottis has minimal visualization but no lesions identified. The scope was removed without difficulty and patient tolerated well.      Assessment/Plan: History of laryngeal carcinoma-he has no evidence of recurrence.  Fiberoptic is completely clean.  I do not have any other options for the mucus as he is already maximized on reflux treatment.  He will follow-up in 1 year sooner if any symptoms arise.  Norleen Notice 10/27/2024, 10:59 AM        [1] No Known Allergies
